# Patient Record
Sex: Male | Born: 1975 | Race: White | Hispanic: No | Marital: Married | Smoking: Former smoker
Health system: Southern US, Community
[De-identification: ages and names within clinical notes are randomized; demographics above are authoritative.]

## PROBLEM LIST (undated history)

## (undated) DIAGNOSIS — K219 Gastro-esophageal reflux disease without esophagitis: Secondary | ICD-10-CM

## (undated) DIAGNOSIS — K760 Fatty (change of) liver, not elsewhere classified: Secondary | ICD-10-CM

## (undated) DIAGNOSIS — G709 Myoneural disorder, unspecified: Secondary | ICD-10-CM

## (undated) DIAGNOSIS — M5030 Other cervical disc degeneration, unspecified cervical region: Secondary | ICD-10-CM

## (undated) DIAGNOSIS — I10 Essential (primary) hypertension: Secondary | ICD-10-CM

## (undated) DIAGNOSIS — J302 Other seasonal allergic rhinitis: Secondary | ICD-10-CM

## (undated) DIAGNOSIS — F419 Anxiety disorder, unspecified: Secondary | ICD-10-CM

## (undated) DIAGNOSIS — E785 Hyperlipidemia, unspecified: Secondary | ICD-10-CM

## (undated) HISTORY — DX: Hyperlipidemia, unspecified: E78.5

## (undated) HISTORY — PX: LUMBAR LAMINECTOMY/DECOMPRESSION MICRODISCECTOMY: SHX5026

## (undated) HISTORY — DX: Fatty (change of) liver, not elsewhere classified: K76.0

## (undated) HISTORY — PX: HERNIA REPAIR: SHX51

## (undated) HISTORY — PX: BRONCHOSCOPY: SUR163

## (undated) HISTORY — PX: WISDOM TOOTH EXTRACTION: SHX21

## (undated) HISTORY — DX: Essential (primary) hypertension: I10

---

## 1999-07-28 ENCOUNTER — Emergency Department (HOSPITAL_COMMUNITY): Admission: EM | Admit: 1999-07-28 | Discharge: 1999-07-28 | Payer: Self-pay | Admitting: Emergency Medicine

## 2001-10-01 ENCOUNTER — Ambulatory Visit (HOSPITAL_COMMUNITY): Admission: RE | Admit: 2001-10-01 | Discharge: 2001-10-01 | Payer: Self-pay | Admitting: *Deleted

## 2005-03-08 ENCOUNTER — Emergency Department (HOSPITAL_COMMUNITY): Admission: EM | Admit: 2005-03-08 | Discharge: 2005-03-08 | Payer: Self-pay | Admitting: Emergency Medicine

## 2016-05-28 ENCOUNTER — Emergency Department (HOSPITAL_BASED_OUTPATIENT_CLINIC_OR_DEPARTMENT_OTHER)
Admission: EM | Admit: 2016-05-28 | Discharge: 2016-05-28 | Disposition: A | Discharge status: Home / Self Care | Payer: 59 | Attending: Physician Assistant | Admitting: Physician Assistant

## 2016-05-28 ENCOUNTER — Encounter (HOSPITAL_BASED_OUTPATIENT_CLINIC_OR_DEPARTMENT_OTHER): Payer: Self-pay | Admitting: *Deleted

## 2016-05-28 DIAGNOSIS — Z79899 Other long term (current) drug therapy: Secondary | ICD-10-CM | POA: Insufficient documentation

## 2016-05-28 DIAGNOSIS — Y929 Unspecified place or not applicable: Secondary | ICD-10-CM | POA: Diagnosis not present

## 2016-05-28 DIAGNOSIS — Y93F2 Activity, caregiving, lifting: Secondary | ICD-10-CM | POA: Insufficient documentation

## 2016-05-28 DIAGNOSIS — X500XXA Overexertion from strenuous movement or load, initial encounter: Secondary | ICD-10-CM | POA: Insufficient documentation

## 2016-05-28 DIAGNOSIS — Y999 Unspecified external cause status: Secondary | ICD-10-CM | POA: Insufficient documentation

## 2016-05-28 DIAGNOSIS — S39012A Strain of muscle, fascia and tendon of lower back, initial encounter: Secondary | ICD-10-CM | POA: Diagnosis not present

## 2016-05-28 DIAGNOSIS — F17290 Nicotine dependence, other tobacco product, uncomplicated: Secondary | ICD-10-CM | POA: Diagnosis not present

## 2016-05-28 DIAGNOSIS — M545 Low back pain: Secondary | ICD-10-CM | POA: Diagnosis present

## 2016-05-28 HISTORY — DX: Other cervical disc degeneration, unspecified cervical region: M50.30

## 2016-05-28 MED ORDER — DIAZEPAM 2 MG PO TABS
2.0000 mg | ORAL_TABLET | Freq: Once | ORAL | Status: AC
  Administered 2016-05-28: 2 mg via ORAL
  Filled 2016-05-28: qty 1

## 2016-05-28 MED ORDER — OXYCODONE-ACETAMINOPHEN 5-325 MG PO TABS
1.0000 | ORAL_TABLET | Freq: Four times a day (QID) | ORAL | 0 refills | Status: DC | PRN
Start: 2016-05-28 — End: 2016-10-26

## 2016-05-28 MED ORDER — DIAZEPAM 5 MG PO TABS: 5.0000 mg | ORAL_TABLET | Freq: Three times a day (TID) | ORAL | 0 refills | Status: DC | PRN

## 2016-05-28 MED ORDER — OXYCODONE-ACETAMINOPHEN 5-325 MG PO TABS
1.0000 | ORAL_TABLET | Freq: Once | ORAL | Status: AC
  Administered 2016-05-28: 1 via ORAL
  Filled 2016-05-28: qty 1

## 2016-05-28 NOTE — ED Notes (Signed)
EDP at [facility], pt alert, NAD, calm, interactive, resps e/u, cooperative.

## 2016-05-28 NOTE — Discharge Instructions (Signed)
You were seen today for a strained back. Please return with fever, weakness, any concerning symptoms. Please follow-up with [facility] orthopedics tomorrow morning.

## 2016-05-28 NOTE — ED Provider Notes (Signed)
[facility] DEPT [facility] Provider Note   CSN: [id number] Arrival date & time: 05/28/16  1815  By signing my name below, I, Irene Pap, attest that this documentation has been prepared under the direction and in the presence of Jakye Mullens Julio Alm, MD. Electronically Signed: Irene Pap, ED Scribe. 05/28/16. 7:51 PM.  History   Chief Complaint Chief Complaint  Patient presents with  . Back Pain   The history is provided by the patient. No language interpreter was used.    HPI Comments: Oluwatamilore Monn Absher is a 40 y.o. male with a hx of cervical DDD who presents to the Emergency Department complaining of sudden onset, tight, stabbing, sharp lower back pain onset earlier this morning. He reports associated occasional tingling to the right lower extremity. Pt states that he was packing to go on vacation this morning when he felt a pop in his lower back. He did not do heavy lifting. He states that the pain radiates to bilateral hips and anterior thighs, right worse than left. He reports worsening pain with ambulation. Pt follows up with a spinal surgeon at [facility] for his DDD. He has taken Tylenol, ibuprofen, and hydrocodone for pain to no relief. He denies injury, fever, chills, bladder or bowel incontinence, numbness or weakness. Pt is allergic to Tramadol.  Past Medical History:  Diagnosis Date  . DDD (degenerative disc disease), cervical     There are no active problems to display for this patient.   History reviewed. No pertinent surgical history.     Home Medications    Prior to Admission medications   Medication Sig Start Date End Date Taking? Authorizing Provider  omeprazole (PRILOSEC) 20 MG capsule Take 20 mg by mouth daily.   Yes Historical Provider, MD    Family History No family history on file.  Social History Social History  Substance Use Topics  . Smoking status: Former Research scientist (life sciences)  . Smokeless tobacco: Current User  . Alcohol use Yes   Comment: occassional     Allergies   Tramadol   Review of Systems Review of Systems  Constitutional: Negative for chills and fever.  Musculoskeletal: Positive for back pain.  Neurological: Negative for weakness and numbness.  All other systems reviewed and are negative. Physical Exam Updated Vital Signs BP (!) 175/103 (BP Location: Right Arm)   Pulse 98   Temp 98.8 F (37.1 C) (Oral)   Resp 18   Ht 6\' 3"  (1.905 m)   Wt 210 lb (95.3 kg)   SpO2 98%   BMI 26.25 kg/m   Physical Exam  Constitutional: He is oriented to person, place, and time. He appears well-developed and well-nourished.  HENT:  Head: Normocephalic and atraumatic.  Eyes: EOM are normal. Pupils are equal, round, and reactive to light.  Neck: Normal range of motion. Neck supple.  Cardiovascular: Normal rate, regular rhythm and normal heart sounds.  Exam reveals no gallop and no friction rub.   No murmur heard. Pulmonary/Chest: Effort normal and breath sounds normal. He has no wheezes.  Abdominal: Soft. There is no tenderness.  Musculoskeletal: Normal range of motion.  Tenderness to the paraspinal region of the lumbar spine; no tenderness to the midline C, T, or L spine.  Neurological: He is alert and oriented to person, place, and time. He has normal strength. No cranial nerve deficit or sensory deficit.  Moves all extremities X4  Skin: Skin is warm and dry.  Psychiatric: He has a normal mood and affect. His behavior is normal.  Nursing note and vitals reviewed.    ED Treatments / Results  DIAGNOSTIC STUDIES: Oxygen Saturation is 98% on RA, normal by my interpretation.    COORDINATION OF CARE: 7:48 PM-Discussed treatment plan which includes percocet, valium, and follow up with orthopedics with pt at bedside and pt agreed to plan.    Labs (all labs ordered are listed, but only abnormal results are displayed) Labs Reviewed - No data to display  EKG  EKG Interpretation None       Radiology No  results found.  Procedures Procedures (including critical care time)  Medications Ordered in ED Medications - No data to display   Initial Impression / Assessment and Plan / ED Course  I have reviewed the triage vital signs and the nursing notes.  Pertinent labs & imaging results that were available during my care of the patient were reviewed by me and considered in my medical decision making (see chart for details).  Clinical Course    Patient is a 40 year old male presenting with back pain after lifting something earlier today. No red flags. No fever or weakness no incontinence. Likely sciatica. Patient seen by [facility] orthopedics he has had multiple cortisone shots in his spine before for degenerative disc disorder. We'll give him 2 days of Percocet and Valium to help with his pain. We encouraged follow-up with orthopedics anmd primary care physician. He says he will follow-up with [facility] orthopedics in the morning.   Final Clinical Impressions(s) / ED Diagnoses   Final diagnoses:  None  I personally performed the services described in this documentation, which was scribed in my presence. The recorded information has been reviewed and is accurate.     New Prescriptions New Prescriptions   No medications on file     Xylon Croom Julio Alm, MD 05/28/16 2005

## 2016-05-28 NOTE — ED Triage Notes (Signed)
Patient states he was packing up to leave the beach this morning when he felt a pop in his lower back.  Describes the pain as tightness in the bilateral lower back with radiation into the bilateral hips, and anterior thighs.  Right is worse than the left.  Patient took tylenol, ibuprofen and one old hydrocodone with no relief.  History of DDD of the cervical spine, but this is the first problems with the lower back.

## 2016-05-28 NOTE — ED Notes (Signed)
D/c'd by Zada Finders, RN, not RH, RN

## 2016-10-20 ENCOUNTER — Ambulatory Visit
Admission: RE | Admit: 2016-10-20 | Discharge: 2016-10-20 | Disposition: A | Discharge status: Home / Self Care | Payer: 59 | Source: Ambulatory Visit | Attending: Neurosurgery | Admitting: Neurosurgery

## 2016-10-20 ENCOUNTER — Other Ambulatory Visit: Payer: Self-pay | Admitting: Neurosurgery

## 2016-10-20 DIAGNOSIS — M4722 Other spondylosis with radiculopathy, cervical region: Principal | ICD-10-CM

## 2016-10-20 DIAGNOSIS — M4712 Other spondylosis with myelopathy, cervical region: Secondary | ICD-10-CM

## 2016-10-23 ENCOUNTER — Other Ambulatory Visit (HOSPITAL_COMMUNITY): Payer: Self-pay | Admitting: *Deleted

## 2016-10-23 NOTE — Pre-Procedure Instructions (Signed)
Brian Tran  10/23/2016    Your procedure is scheduled on Wednesday, October 25, 2016 at 10:00 AM.   Report to [facility] Entrance "A" Admitting Office at 8:00 AM.   Call this number if you have problems the morning of surgery: [phone]   Remember:  Do not eat food or drink liquids after midnight tonight.  Take these medicines the morning of surgery with A SIP OF WATER: Omeprazole (Prilosec) - if needed, Tylenol - if needed   Do not wear jewelry.  Do not wear lotions, powders or cologne.  Men may shave face and neck.  Do not bring valuables to the hospital.  [facility] is not responsible for any belongings or valuables.  Contacts, dentures or bridgework may not be worn into surgery.  Leave your suitcase in the car.  After surgery it may be brought to your room.  For patients admitted to the hospital, discharge time will be determined by your treatment team.  Patients discharged the day of surgery will not be allowed to drive home.   Special instructions:  [facility] - Preparing for Surgery  Before surgery, you can play an important role.  Because skin is not sterile, your skin needs to be as free of germs as possible.  You can reduce the number of germs on you skin by washing with CHG (chlorahexidine gluconate) soap before surgery.  CHG is an antiseptic cleaner which kills germs and bonds with the skin to continue killing germs even after washing.  Please DO NOT use if you have an allergy to CHG or antibacterial soaps.  If your skin becomes reddened/irritated stop using the CHG and inform your nurse when you arrive at Short Stay.  Do not shave (including legs and underarms) for at least 48 hours prior to the first CHG shower.  You may shave your face.  Please follow these instructions carefully:   1.  Shower with CHG Soap the night before surgery and the                    morning of Surgery.  2.  If you choose to wash your hair, wash your hair first as usual  with your       normal shampoo.  3.  After you shampoo, rinse your hair and body thoroughly to remove the shampoo.  4.  Use CHG as you would any other liquid soap.  You can apply chg directly       to the skin and wash gently with scrungie or a clean washcloth.  5.  Apply the CHG Soap to your body ONLY FROM THE NECK DOWN.        Do not use on open wounds or open sores.  Avoid contact with your eyes, ears, mouth and genitals (private parts).  Wash genitals (private parts) with your normal soap.  6.  Wash thoroughly, paying special attention to the area where your surgery        will be performed.  7.  Thoroughly rinse your body with warm water from the neck down.  8.  DO NOT shower/wash with your normal soap after using and rinsing off       the CHG Soap.  9.  Pat yourself dry with a clean towel.            10.  Wear clean pajamas.            11.  Place clean sheets on  your bed the night of your first shower and do not        sleep with pets.  Day of Surgery  Do not apply any lotions the morning of surgery.  Please wear clean clothes to the hospital.   Please read over the fact sheets that you were given.

## 2016-10-24 ENCOUNTER — Encounter (HOSPITAL_COMMUNITY)
Admission: RE | Admit: 2016-10-24 | Discharge: 2016-10-24 | Disposition: A | Discharge status: Home / Self Care | Payer: 59 | Source: Ambulatory Visit | Attending: Neurosurgery | Admitting: Neurosurgery

## 2016-10-24 ENCOUNTER — Encounter (HOSPITAL_COMMUNITY): Payer: Self-pay

## 2016-10-24 DIAGNOSIS — M4802 Spinal stenosis, cervical region: Secondary | ICD-10-CM | POA: Insufficient documentation

## 2016-10-24 DIAGNOSIS — F419 Anxiety disorder, unspecified: Secondary | ICD-10-CM | POA: Diagnosis not present

## 2016-10-24 DIAGNOSIS — M1990 Unspecified osteoarthritis, unspecified site: Secondary | ICD-10-CM | POA: Diagnosis not present

## 2016-10-24 DIAGNOSIS — Z01812 Encounter for preprocedural laboratory examination: Secondary | ICD-10-CM | POA: Insufficient documentation

## 2016-10-24 DIAGNOSIS — Z87891 Personal history of nicotine dependence: Secondary | ICD-10-CM | POA: Diagnosis not present

## 2016-10-24 DIAGNOSIS — M5001 Cervical disc disorder with myelopathy,  high cervical region: Secondary | ICD-10-CM | POA: Diagnosis not present

## 2016-10-24 DIAGNOSIS — Z0183 Encounter for blood typing: Secondary | ICD-10-CM | POA: Insufficient documentation

## 2016-10-24 DIAGNOSIS — M4712 Other spondylosis with myelopathy, cervical region: Secondary | ICD-10-CM | POA: Diagnosis not present

## 2016-10-24 DIAGNOSIS — K219 Gastro-esophageal reflux disease without esophagitis: Secondary | ICD-10-CM | POA: Diagnosis not present

## 2016-10-24 HISTORY — DX: Myoneural disorder, unspecified: G70.9

## 2016-10-24 HISTORY — DX: Other seasonal allergic rhinitis: J30.2

## 2016-10-24 HISTORY — DX: Anxiety disorder, unspecified: F41.9

## 2016-10-24 HISTORY — DX: Gastro-esophageal reflux disease without esophagitis: K21.9

## 2016-10-24 LAB — CBC
HCT: 48.9 % (ref 39.0–52.0)
Hemoglobin: 16.6 g/dL (ref 13.0–17.0)
MCH: 32.8 pg (ref 26.0–34.0)
MCHC: 33.9 g/dL (ref 30.0–36.0)
MCV: 96.6 fL (ref 78.0–100.0)
PLATELETS: 242 10*3/uL (ref 150–400)
RBC: 5.06 MIL/uL (ref 4.22–5.81)
RDW: 12.4 % (ref 11.5–15.5)
WBC: 6.5 10*3/uL (ref 4.0–10.5)

## 2016-10-24 LAB — TYPE AND SCREEN
ABO/RH(D): O POS
Antibody Screen: NEGATIVE

## 2016-10-24 LAB — SURGICAL PCR SCREEN
MRSA, PCR: NEGATIVE
Staphylococcus aureus: NEGATIVE

## 2016-10-24 LAB — ABO/RH: ABO/RH(D): O POS

## 2016-10-24 NOTE — Progress Notes (Signed)
Pt denies cardiac history, chest pain or sob. 

## 2016-10-25 ENCOUNTER — Encounter (HOSPITAL_COMMUNITY): Payer: Self-pay | Admitting: *Deleted

## 2016-10-25 ENCOUNTER — Observation Stay (HOSPITAL_COMMUNITY)
Admission: RE | Admit: 2016-10-25 | Discharge: 2016-10-26 | Disposition: A | Discharge status: Home / Self Care | Payer: 59 | Source: Ambulatory Visit | Attending: Neurosurgery | Admitting: Neurosurgery

## 2016-10-25 ENCOUNTER — Encounter (HOSPITAL_COMMUNITY)
Admission: RE | Disposition: A | Discharge status: Home / Self Care | Payer: Self-pay | Source: Ambulatory Visit | Attending: Neurosurgery

## 2016-10-25 ENCOUNTER — Ambulatory Visit (HOSPITAL_COMMUNITY): Payer: 59 | Admitting: Certified Registered Nurse Anesthetist

## 2016-10-25 ENCOUNTER — Ambulatory Visit (HOSPITAL_COMMUNITY): Payer: 59

## 2016-10-25 DIAGNOSIS — F419 Anxiety disorder, unspecified: Secondary | ICD-10-CM | POA: Insufficient documentation

## 2016-10-25 DIAGNOSIS — Z87891 Personal history of nicotine dependence: Secondary | ICD-10-CM | POA: Insufficient documentation

## 2016-10-25 DIAGNOSIS — M1990 Unspecified osteoarthritis, unspecified site: Secondary | ICD-10-CM | POA: Insufficient documentation

## 2016-10-25 DIAGNOSIS — K219 Gastro-esophageal reflux disease without esophagitis: Secondary | ICD-10-CM | POA: Insufficient documentation

## 2016-10-25 DIAGNOSIS — M4802 Spinal stenosis, cervical region: Secondary | ICD-10-CM | POA: Diagnosis not present

## 2016-10-25 DIAGNOSIS — Z419 Encounter for procedure for purposes other than remedying health state, unspecified: Secondary | ICD-10-CM

## 2016-10-25 DIAGNOSIS — M5020 Other cervical disc displacement, unspecified cervical region: Secondary | ICD-10-CM | POA: Diagnosis present

## 2016-10-25 DIAGNOSIS — M5001 Cervical disc disorder with myelopathy,  high cervical region: Secondary | ICD-10-CM | POA: Insufficient documentation

## 2016-10-25 DIAGNOSIS — M4712 Other spondylosis with myelopathy, cervical region: Secondary | ICD-10-CM | POA: Insufficient documentation

## 2016-10-25 HISTORY — PX: ANTERIOR CERVICAL DECOMPRESSION/DISCECTOMY FUSION 4 LEVELS: SHX5556

## 2016-10-25 SURGERY — ANTERIOR CERVICAL DECOMPRESSION/DISCECTOMY FUSION 4 LEVELS
Anesthesia: General

## 2016-10-25 MED ORDER — OXYCODONE HCL 5 MG PO TABS: 5.0000 mg | ORAL_TABLET | Freq: Once | ORAL | Status: DC | PRN

## 2016-10-25 MED ORDER — LACTATED RINGERS IV SOLN
INTRAVENOUS | Status: DC | PRN
  Administered 2016-10-25 (×2): via INTRAVENOUS

## 2016-10-25 MED ORDER — HYDROMORPHONE HCL 1 MG/ML IJ SOLN
INTRAMUSCULAR | Status: AC
  Filled 2016-10-25: qty 0.5

## 2016-10-25 MED ORDER — ARTIFICIAL TEARS OP OINT
TOPICAL_OINTMENT | OPHTHALMIC | Status: AC
  Filled 2016-10-25: qty 3.5

## 2016-10-25 MED ORDER — PANTOPRAZOLE SODIUM 40 MG PO TBEC
80.0000 mg | DELAYED_RELEASE_TABLET | Freq: Every day | ORAL | Status: DC
  Filled 2016-10-25: qty 2

## 2016-10-25 MED ORDER — FENTANYL CITRATE (PF) 100 MCG/2ML IJ SOLN
INTRAMUSCULAR | Status: AC
  Filled 2016-10-25: qty 2

## 2016-10-25 MED ORDER — LIDOCAINE-EPINEPHRINE (PF) 2 %-1:200000 IJ SOLN
INTRAMUSCULAR | Status: DC | PRN
Start: 2016-10-25 — End: 2016-10-25
  Administered 2016-10-25: 17 mL via INTRADERMAL

## 2016-10-25 MED ORDER — ONDANSETRON HCL 4 MG/2ML IJ SOLN: 4.0000 mg | Freq: Four times a day (QID) | INTRAMUSCULAR | Status: DC | PRN

## 2016-10-25 MED ORDER — CEFAZOLIN SODIUM-DEXTROSE 2-4 GM/100ML-% IV SOLN
2.0000 g | INTRAVENOUS | Status: AC
  Administered 2016-10-25 (×2): 2 g via INTRAVENOUS

## 2016-10-25 MED ORDER — CYCLOBENZAPRINE HCL 5 MG PO TABS
5.0000 mg | ORAL_TABLET | Freq: Three times a day (TID) | ORAL | Status: DC | PRN
  Administered 2016-10-25 (×2): 10 mg via ORAL
  Filled 2016-10-25: qty 2

## 2016-10-25 MED ORDER — HYDROXYZINE HCL 25 MG PO TABS: 50.0000 mg | ORAL_TABLET | ORAL | Status: DC | PRN

## 2016-10-25 MED ORDER — DEXAMETHASONE SODIUM PHOSPHATE 4 MG/ML IJ SOLN
INTRAMUSCULAR | Status: AC
  Administered 2016-10-25: 4 mg
  Filled 2016-10-25: qty 1

## 2016-10-25 MED ORDER — LIDOCAINE 2% (20 MG/ML) 5 ML SYRINGE
INTRAMUSCULAR | Status: AC
  Filled 2016-10-25: qty 5

## 2016-10-25 MED ORDER — ACETAMINOPHEN 10 MG/ML IV SOLN
INTRAVENOUS | Status: AC
  Filled 2016-10-25: qty 100

## 2016-10-25 MED ORDER — SODIUM CHLORIDE 0.9% FLUSH
3.0000 mL | Freq: Two times a day (BID) | INTRAVENOUS | Status: DC
  Administered 2016-10-25 – 2016-10-26 (×2): 3 mL via INTRAVENOUS

## 2016-10-25 MED ORDER — ROCURONIUM BROMIDE 50 MG/5ML IV SOSY
PREFILLED_SYRINGE | INTRAVENOUS | Status: AC
  Filled 2016-10-25: qty 5

## 2016-10-25 MED ORDER — SUGAMMADEX SODIUM 200 MG/2ML IV SOLN
INTRAVENOUS | Status: DC | PRN
  Administered 2016-10-25: 200 mg via INTRAVENOUS

## 2016-10-25 MED ORDER — 0.9 % SODIUM CHLORIDE (POUR BTL) OPTIME
TOPICAL | Status: DC | PRN
  Administered 2016-10-25: 1000 mL

## 2016-10-25 MED ORDER — KETOROLAC TROMETHAMINE 30 MG/ML IJ SOLN
30.0000 mg | Freq: Once | INTRAMUSCULAR | Status: AC
  Administered 2016-10-25: 30 mg via INTRAVENOUS

## 2016-10-25 MED ORDER — THROMBIN 20000 UNITS EX SOLR
CUTANEOUS | Status: AC
  Filled 2016-10-25: qty 20000

## 2016-10-25 MED ORDER — ALPRAZOLAM 1 MG PO TABS: 2.5000 mg | ORAL_TABLET | Freq: Every evening | ORAL | Status: DC | PRN

## 2016-10-25 MED ORDER — LIDOCAINE 2% (20 MG/ML) 5 ML SYRINGE
INTRAMUSCULAR | Status: DC | PRN
  Administered 2016-10-25: 60 mg via INTRAVENOUS

## 2016-10-25 MED ORDER — LIDOCAINE HCL 4 % EX SOLN
CUTANEOUS | Status: DC | PRN
  Administered 2016-10-25: 2 mL via TOPICAL

## 2016-10-25 MED ORDER — SUGAMMADEX SODIUM 200 MG/2ML IV SOLN
INTRAVENOUS | Status: AC
  Filled 2016-10-25: qty 2

## 2016-10-25 MED ORDER — LACTATED RINGERS IV SOLN
INTRAVENOUS | Status: DC
  Administered 2016-10-25 (×3): via INTRAVENOUS

## 2016-10-25 MED ORDER — BISACODYL 10 MG RE SUPP: 10.0000 mg | Freq: Every day | RECTAL | Status: DC | PRN

## 2016-10-25 MED ORDER — HYDROMORPHONE HCL 1 MG/ML IJ SOLN
0.2500 mg | INTRAMUSCULAR | Status: DC | PRN
  Administered 2016-10-25 (×2): 0.5 mg via INTRAVENOUS

## 2016-10-25 MED ORDER — SODIUM CHLORIDE 0.9% FLUSH: 3.0000 mL | INTRAVENOUS | Status: DC | PRN

## 2016-10-25 MED ORDER — FENTANYL CITRATE (PF) 100 MCG/2ML IJ SOLN
INTRAMUSCULAR | Status: DC | PRN
  Administered 2016-10-25 (×3): 100 ug via INTRAVENOUS
  Administered 2016-10-25 (×2): 50 ug via INTRAVENOUS

## 2016-10-25 MED ORDER — HYDROXYZINE HCL 50 MG/ML IM SOLN: 50.0000 mg | INTRAMUSCULAR | Status: DC | PRN

## 2016-10-25 MED ORDER — KETOROLAC TROMETHAMINE 30 MG/ML IJ SOLN
30.0000 mg | Freq: Four times a day (QID) | INTRAMUSCULAR | Status: DC
  Administered 2016-10-25 – 2016-10-26 (×2): 30 mg via INTRAVENOUS
  Filled 2016-10-25 (×2): qty 1

## 2016-10-25 MED ORDER — LIDOCAINE-EPINEPHRINE (PF) 2 %-1:200000 IJ SOLN
INTRAMUSCULAR | Status: AC
  Filled 2016-10-25: qty 20

## 2016-10-25 MED ORDER — ROCURONIUM BROMIDE 100 MG/10ML IV SOLN
INTRAVENOUS | Status: DC | PRN
  Administered 2016-10-25: 20 mg via INTRAVENOUS
  Administered 2016-10-25 (×2): 10 mg via INTRAVENOUS
  Administered 2016-10-25: 20 mg via INTRAVENOUS
  Administered 2016-10-25: 50 mg via INTRAVENOUS
  Administered 2016-10-25: 20 mg via INTRAVENOUS
  Administered 2016-10-25: 10 mg via INTRAVENOUS

## 2016-10-25 MED ORDER — SODIUM CHLORIDE 0.9 % IR SOLN
Status: DC | PRN
  Administered 2016-10-25: 10:00:00

## 2016-10-25 MED ORDER — CYCLOBENZAPRINE HCL 10 MG PO TABS
ORAL_TABLET | ORAL | Status: AC
  Filled 2016-10-25: qty 1

## 2016-10-25 MED ORDER — CEFAZOLIN SODIUM 1 G IJ SOLR
INTRAMUSCULAR | Status: AC
  Filled 2016-10-25: qty 20

## 2016-10-25 MED ORDER — MIDAZOLAM HCL 2 MG/2ML IJ SOLN
INTRAMUSCULAR | Status: AC
  Filled 2016-10-25: qty 2

## 2016-10-25 MED ORDER — CHLORHEXIDINE GLUCONATE CLOTH 2 % EX PADS: 6.0000 | MEDICATED_PAD | Freq: Once | CUTANEOUS | Status: DC

## 2016-10-25 MED ORDER — THROMBIN 5000 UNITS EX SOLR
CUTANEOUS | Status: AC
  Filled 2016-10-25: qty 5000

## 2016-10-25 MED ORDER — KCL IN DEXTROSE-NACL 20-5-0.45 MEQ/L-%-% IV SOLN: INTRAVENOUS | Status: DC

## 2016-10-25 MED ORDER — OXYCODONE-ACETAMINOPHEN 5-325 MG PO TABS
1.0000 | ORAL_TABLET | ORAL | Status: DC | PRN
  Administered 2016-10-25: 2 via ORAL

## 2016-10-25 MED ORDER — ACETAMINOPHEN 650 MG RE SUPP: 650.0000 mg | RECTAL | Status: DC | PRN

## 2016-10-25 MED ORDER — MORPHINE SULFATE (PF) 4 MG/ML IV SOLN: 4.0000 mg | INTRAVENOUS | Status: DC | PRN

## 2016-10-25 MED ORDER — FLEET ENEMA 7-19 GM/118ML RE ENEM
1.0000 | ENEMA | Freq: Once | RECTAL | Status: DC | PRN
Start: 2016-10-25 — End: 2016-10-26

## 2016-10-25 MED ORDER — OXYCODONE HCL 5 MG/5ML PO SOLN: 5.0000 mg | Freq: Once | ORAL | Status: DC | PRN

## 2016-10-25 MED ORDER — MAGNESIUM HYDROXIDE 400 MG/5ML PO SUSP: 30.0000 mL | Freq: Every day | ORAL | Status: DC | PRN

## 2016-10-25 MED ORDER — HYDROCODONE-ACETAMINOPHEN 5-325 MG PO TABS
1.0000 | ORAL_TABLET | ORAL | Status: DC | PRN
  Administered 2016-10-25 – 2016-10-26 (×4): 2 via ORAL
  Filled 2016-10-25 (×4): qty 2

## 2016-10-25 MED ORDER — DEXAMETHASONE SODIUM PHOSPHATE 10 MG/ML IJ SOLN
INTRAMUSCULAR | Status: AC
  Filled 2016-10-25: qty 1

## 2016-10-25 MED ORDER — BUPIVACAINE HCL (PF) 0.5 % IJ SOLN
INTRAMUSCULAR | Status: DC | PRN
  Administered 2016-10-25: 17 mL

## 2016-10-25 MED ORDER — MIDAZOLAM HCL 2 MG/2ML IJ SOLN
INTRAMUSCULAR | Status: DC | PRN
  Administered 2016-10-25: 2 mg via INTRAVENOUS

## 2016-10-25 MED ORDER — SODIUM CHLORIDE 0.9 % IV SOLN
8.0000 mg | Freq: Four times a day (QID) | INTRAVENOUS | Status: DC | PRN
  Filled 2016-10-25: qty 4

## 2016-10-25 MED ORDER — ONDANSETRON HCL 4 MG/2ML IJ SOLN
INTRAMUSCULAR | Status: AC
  Filled 2016-10-25: qty 2

## 2016-10-25 MED ORDER — ALUM & MAG HYDROXIDE-SIMETH 200-200-20 MG/5ML PO SUSP: 30.0000 mL | Freq: Four times a day (QID) | ORAL | Status: DC | PRN

## 2016-10-25 MED ORDER — ACETAMINOPHEN 325 MG PO TABS: 650.0000 mg | ORAL_TABLET | ORAL | Status: DC | PRN

## 2016-10-25 MED ORDER — PHENYLEPHRINE HCL 10 MG/ML IJ SOLN
INTRAVENOUS | Status: DC | PRN
  Administered 2016-10-25: 25 ug/min via INTRAVENOUS

## 2016-10-25 MED ORDER — OXYCODONE-ACETAMINOPHEN 5-325 MG PO TABS
ORAL_TABLET | ORAL | Status: AC
  Filled 2016-10-25: qty 2

## 2016-10-25 MED ORDER — ACETAMINOPHEN 10 MG/ML IV SOLN
INTRAVENOUS | Status: DC | PRN
  Administered 2016-10-25: 1000 mg via INTRAVENOUS

## 2016-10-25 MED ORDER — PROPOFOL 10 MG/ML IV BOLUS
INTRAVENOUS | Status: AC
  Filled 2016-10-25: qty 20

## 2016-10-25 MED ORDER — KETOROLAC TROMETHAMINE 30 MG/ML IJ SOLN
INTRAMUSCULAR | Status: AC
  Filled 2016-10-25: qty 1

## 2016-10-25 MED ORDER — PROPOFOL 10 MG/ML IV BOLUS
INTRAVENOUS | Status: DC | PRN
  Administered 2016-10-25: 200 mg via INTRAVENOUS

## 2016-10-25 MED ORDER — CEFAZOLIN SODIUM-DEXTROSE 2-4 GM/100ML-% IV SOLN
INTRAVENOUS | Status: AC
  Filled 2016-10-25: qty 100

## 2016-10-25 MED ORDER — ONDANSETRON HCL 4 MG/2ML IJ SOLN
INTRAMUSCULAR | Status: DC | PRN
Start: 2016-10-25 — End: 2016-10-25
  Administered 2016-10-25: 4 mg via INTRAVENOUS

## 2016-10-25 MED ORDER — BUPIVACAINE HCL (PF) 0.5 % IJ SOLN
INTRAMUSCULAR | Status: AC
  Filled 2016-10-25: qty 30

## 2016-10-25 MED ORDER — FENTANYL CITRATE (PF) 100 MCG/2ML IJ SOLN
INTRAMUSCULAR | Status: AC
  Filled 2016-10-25: qty 4

## 2016-10-25 MED ORDER — THROMBIN 5000 UNITS EX SOLR
CUTANEOUS | Status: DC | PRN
  Administered 2016-10-25: 10:00:00 via TOPICAL

## 2016-10-25 MED ORDER — DEXAMETHASONE SODIUM PHOSPHATE 4 MG/ML IJ SOLN
4.0000 mg | Freq: Four times a day (QID) | INTRAMUSCULAR | Status: DC
  Administered 2016-10-25 – 2016-10-26 (×3): 4 mg via INTRAVENOUS
  Filled 2016-10-25 (×3): qty 1

## 2016-10-25 MED ORDER — THROMBIN 20000 UNITS EX SOLR
CUTANEOUS | Status: DC | PRN
  Administered 2016-10-25 (×2): via TOPICAL

## 2016-10-25 MED ORDER — ONDANSETRON HCL 4 MG PO TABS: 4.0000 mg | ORAL_TABLET | Freq: Four times a day (QID) | ORAL | Status: DC | PRN

## 2016-10-25 MED ORDER — MENTHOL 3 MG MT LOZG: 1.0000 | LOZENGE | OROMUCOSAL | Status: DC | PRN

## 2016-10-25 MED ORDER — DEXAMETHASONE SODIUM PHOSPHATE 10 MG/ML IJ SOLN
INTRAMUSCULAR | Status: DC | PRN
  Administered 2016-10-25: 10 mg via INTRAVENOUS

## 2016-10-25 MED ORDER — PHENOL 1.4 % MT LIQD
1.0000 | OROMUCOSAL | Status: DC | PRN
  Filled 2016-10-25: qty 177

## 2016-10-25 MED ORDER — ALPRAZOLAM 0.5 MG PO TABS: 1.0000 mg | ORAL_TABLET | Freq: Every evening | ORAL | Status: DC | PRN

## 2016-10-25 SURGICAL SUPPLY — 64 items
ALLOGRAFT 7X14X11 (Bone Implant) ×6 IMPLANT
ALLOGRAFT CA 6X14X11 (Bone Implant) ×2 IMPLANT
BAG DECANTER FOR FLEXI CONT (MISCELLANEOUS) ×2 IMPLANT
BIT DRILL HYBRID 2.5X14 (BIT) ×2 IMPLANT
BIT DRILL NEURO 2X3.1 SFT TUCH (MISCELLANEOUS) ×1 IMPLANT
BLADE ULTRA TIP 2M (BLADE) ×2 IMPLANT
CANISTER SUCT 3000ML PPV (MISCELLANEOUS) ×2 IMPLANT
CARTRIDGE OIL MAESTRO DRILL (MISCELLANEOUS) ×1 IMPLANT
COVER MAYO STAND STRL (DRAPES) ×2 IMPLANT
DECANTER SPIKE VIAL GLASS SM (MISCELLANEOUS) ×2 IMPLANT
DERMABOND ADVANCED (GAUZE/BANDAGES/DRESSINGS) ×1
DERMABOND ADVANCED .7 DNX12 (GAUZE/BANDAGES/DRESSINGS) ×1 IMPLANT
DIFFUSER DRILL AIR PNEUMATIC (MISCELLANEOUS) ×2 IMPLANT
DRAPE HALF SHEET 40X57 (DRAPES) ×2 IMPLANT
DRAPE LAPAROTOMY 100X72 PEDS (DRAPES) ×2 IMPLANT
DRAPE MICROSCOPE LEICA (MISCELLANEOUS) ×2 IMPLANT
DRAPE POUCH INSTRU U-SHP 10X18 (DRAPES) ×2 IMPLANT
DRILL NEURO 2X3.1 SOFT TOUCH (MISCELLANEOUS) ×2
ELECT COATED BLADE 2.86 ST (ELECTRODE) ×2 IMPLANT
ELECT REM PT RETURN 9FT ADLT (ELECTROSURGICAL) ×2
ELECTRODE REM PT RTRN 9FT ADLT (ELECTROSURGICAL) ×1 IMPLANT
GLOVE BIOGEL PI IND STRL 7.5 (GLOVE) ×1 IMPLANT
GLOVE BIOGEL PI IND STRL 8 (GLOVE) ×1 IMPLANT
GLOVE BIOGEL PI INDICATOR 7.5 (GLOVE) ×1
GLOVE BIOGEL PI INDICATOR 8 (GLOVE) ×1
GLOVE ECLIPSE 7.5 STRL STRAW (GLOVE) ×6 IMPLANT
GLOVE EXAM NITRILE LRG STRL (GLOVE) IMPLANT
GLOVE EXAM NITRILE XL STR (GLOVE) IMPLANT
GLOVE EXAM NITRILE XS STR PU (GLOVE) IMPLANT
GLOVE INDICATOR 7.0 STRL GRN (GLOVE) ×2 IMPLANT
GLOVE INDICATOR 7.5 STRL GRN (GLOVE) ×2 IMPLANT
GLOVE INDICATOR 8.0 STRL GRN (GLOVE) ×2 IMPLANT
GLOVE SURG SS PI 7.0 STRL IVOR (GLOVE) ×2 IMPLANT
GOWN STRL REUS W/ TWL LRG LVL3 (GOWN DISPOSABLE) ×2 IMPLANT
GOWN STRL REUS W/ TWL XL LVL3 (GOWN DISPOSABLE) ×2 IMPLANT
GOWN STRL REUS W/TWL 2XL LVL3 (GOWN DISPOSABLE) ×2 IMPLANT
GOWN STRL REUS W/TWL LRG LVL3 (GOWN DISPOSABLE) ×2
GOWN STRL REUS W/TWL XL LVL3 (GOWN DISPOSABLE) ×2
HALTER HD/CHIN CERV TRACTION D (MISCELLANEOUS) ×2 IMPLANT
HEMOSTAT POWDER KIT SURGIFOAM (HEMOSTASIS) ×2 IMPLANT
KIT BASIN OR (CUSTOM PROCEDURE TRAY) ×2 IMPLANT
KIT ROOM TURNOVER OR (KITS) ×2 IMPLANT
NEEDLE HYPO 25X1 1.5 SAFETY (NEEDLE) ×2 IMPLANT
NEEDLE SPNL 22GX3.5 QUINCKE BK (NEEDLE) ×4 IMPLANT
NS IRRIG 1000ML POUR BTL (IV SOLUTION) ×2 IMPLANT
OIL CARTRIDGE MAESTRO DRILL (MISCELLANEOUS) ×2
PACK LAMINECTOMY NEURO (CUSTOM PROCEDURE TRAY) ×2 IMPLANT
PAD ARMBOARD 7.5X6 YLW CONV (MISCELLANEOUS) ×6 IMPLANT
PATTIES SURGICAL .5 X.5 (GAUZE/BANDAGES/DRESSINGS) ×2 IMPLANT
PATTIES SURGICAL 1X1 (DISPOSABLE) ×4 IMPLANT
PLATE SPINAL 390MM RADIUS 68MM (Plate) ×2 IMPLANT
RUBBERBAND STERILE (MISCELLANEOUS) ×4 IMPLANT
SCREW R HYBIRD VA 4.0X14MM (Screw) ×20 IMPLANT
SPONGE GAUZE 4X4 12PLY STER LF (GAUZE/BANDAGES/DRESSINGS) ×4 IMPLANT
SPONGE INTESTINAL PEANUT (DISPOSABLE) ×2 IMPLANT
SPONGE SURGIFOAM ABS GEL 100 (HEMOSTASIS) ×4 IMPLANT
STAPLER SKIN PROX WIDE 3.9 (STAPLE) ×2 IMPLANT
SUT VIC AB 0 CT1 18XCR BRD8 (SUTURE) IMPLANT
SUT VIC AB 0 CT1 8-18 (SUTURE)
SUT VIC AB 2-0 CP2 18 (SUTURE) ×4 IMPLANT
SUT VIC AB 3-0 SH 8-18 (SUTURE) ×6 IMPLANT
TOWEL OR 17X24 6PK STRL BLUE (TOWEL DISPOSABLE) ×2 IMPLANT
TOWEL OR 17X26 10 PK STRL BLUE (TOWEL DISPOSABLE) ×2 IMPLANT
WATER STERILE IRR 1000ML POUR (IV SOLUTION) ×2 IMPLANT

## 2016-10-25 NOTE — H&P (Signed)
Subjective: Patient is a 41 y.o. right-handed white male who is admitted for treatment of advanced multilevel degeneration in the cervical spine, with resulting cervical stenosis.Marland Kitchen  He initially presented with acute right-sided low back and right lumbar radicular pain. However he is also had difficulties with posterior neck pain and intrascapular pain. He's had a sense of weakness in the right upper extremity, areas of numbness and tingling in the left upper family including the arm, forearm, hand, with more mild numbness and tingling in the right hand, and a sense of weakness in the proximal lower extremities, with that varying areas of numbness and tingling in the lower extremities. He was studied with x-rays and MRI scan of the cervical and lumbar spine.  In the cervical spine he has disc herniations at C3-4, C4-5, C5-6, and C6-7, with underlying cervical spondylosis and degenerative disease at each level, with resulting cervical stenosis and spinal cord compression due to congenital and degenerative stenosis at the C3-4, C4-5, C5-6, and C6-7 levels were notably the MRI did not show alteration of cord signal. Patient was further studied with CT which showed calcification of the posterior longitudinal ligament behind each of the vertebra, but not across the disc space levels. Patient is admitted now for 4 level C3-4, C4-5, C5-6, and C6-7 anterior cervical decompression and arthrodesis with structural allograft and cervical plating.   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   Past Medical History:  Diagnosis Date  . Anxiety   . DDD (degenerative disc disease), cervical   . GERD (gastroesophageal reflux disease)   . Neuromuscular disorder ([place])    right ulnar nerve pain  . Seasonal allergies     Past  Surgical History:  Procedure Laterality Date  . BRONCHOSCOPY    . HERNIA REPAIR Right    inguinal hernia  . WISDOM TOOTH EXTRACTION      Prescriptions Prior to Admission  Medication Sig Dispense Refill Last Dose  . acetaminophen (TYLENOL) 500 MG tablet Take 1,000 mg by mouth every 6 (six) hours as needed (for pain.).   10/25/2016 at 0500  . alprazolam (XANAX) 2 MG tablet Take 2.5 mg by mouth at bedtime as needed for sleep.   10/25/2016 at 0100  . omeprazole (PRILOSEC) 40 MG capsule Take 40 mg by mouth daily as needed (for heartburn/acid reflux.).   10/25/2016 at 0500  . diazepam (VALIUM) 5 MG tablet Take 1 tablet (5 mg total) by mouth every 8 (eight) hours as needed for anxiety. (Patient not taking: Reported on 10/23/2016) 10 tablet 0 Not Taking at Unknown time  . oxyCODONE-acetaminophen (PERCOCET/ROXICET) 5-325 MG tablet Take 1 tablet by mouth every 6 (six) hours as needed for severe pain. (Patient not taking: Reported on 10/23/2016) 11 tablet 0 Not Taking at Unknown time   No Known Allergies  Social History  Substance Use Topics  . Smoking status: Former Smoker    Quit date: 10/24/1996  . Smokeless tobacco: Current User    Types: Snuff  . Alcohol use Yes     Comment: occassional    Family History  Problem Relation Age of Onset  . Kidney cancer Father   . COPD Father   . Hypertension Father      Review of Systems Pertinent items noted in HPI and remainder of comprehensive ROS otherwise negative.  Objective: Vital signs in last 24 hours: Temp:  [98.6 F (37 C)] 98.6 F (37 C) (02/07 0810) Pulse Rate:  [83] 83 (02/07 0810) BP: (154)/(103) 154/103 (02/07 0810) SpO2:  [99 %] 99 % (02/07 0810) Weight:  [94.8 kg (209 lb)] 94.8 kg (209 lb) (02/07 0810)  EXAM: Patient is well-developed well-nourished white male in no acute distress. Lungs are clear to auscultation , the patient has symmetrical respiratory excursion. Heart has a regular rate and rhythm normal S1 and S2 no murmur.   Abdomen  is soft nontender nondistended bowel sounds are present. Extremity examination shows no clubbing cyanosis or edema. Neurologic examination shows the left upper family has 5/5 strength in the deltoid, biceps, triceps, intrinsics, and grip, but he has weakness in the right upper family, with the right deltoid and biceps being 4-4+ over 5, the right triceps intrinsics are 5/5, and the right grip is 4/5. In the lower extremities, the iliopsoas and quadriceps are 4/5 bilaterally. Sensation is intact to pinprick in the upper and lower lower extremities. Reflexes are one in the quadrants, 2 at the gastrocnemius. They're symmetrical body. Toes are downgoing bilaterally. Gait and stance both favor the right lower extremity, and he limps as he walks.  Data Review:CBC    Component Value Date/Time   WBC 6.5 10/24/2016 1001   RBC 5.06 10/24/2016 1001   HGB 16.6 10/24/2016 1001   HCT 48.9 10/24/2016 1001   PLT 242 10/24/2016 1001   MCV 96.6 10/24/2016 1001   MCH 32.8 10/24/2016 1001   MCHC 33.9 10/24/2016 1001   RDW 12.4 10/24/2016 1001    Assessment/Plan: Patient with degenerative changes and congenital issues involving both the cervical and lumbar spine as described above. He has significant cervical stenosis at 4 levels, is admitted now for 4 level ACDF.  I've discussed with the patient the nature of his condition, the nature the surgical procedure, the typical length of surgery, hospital stay, and overall recuperation. We discussed limitations postoperatively. I discussed risks of surgery including risks of infection, bleeding, possibly need for transfusion, the risk of nerve root dysfunction with pain, weakness, numbness, or paresthesias, the risk of spinal cord dysfunction with paralysis of all 4 limbs and quadriplegia, and the risk of dural tear and CSF leakage and possible need for further surgery, the risk of esophageal dysfunction causing dysphagia and the risk of laryngeal dysfunction causing  hoarseness of the voice, the risk of failure of the arthrodesis and the possible need for further surgery, and the risk of anesthetic complications including myocardial infarction, stroke, pneumonia, and death. We also discussed the need for postoperative immobilization in a cervical collar. Understanding all this the patient does wish to proceed with surgery and is admitted for such.    Hosie Spangle, MD 10/25/2016 9:58 AM

## 2016-10-25 NOTE — Anesthesia Procedure Notes (Signed)
Procedure Name: Intubation Date/Time: 10/25/2016 10:12 AM Performed by: Trixie Deis A Pre-anesthesia Checklist: Patient identified, Emergency Drugs available, Suction available and Patient being monitored Patient Re-evaluated:Patient Re-evaluated prior to inductionOxygen Delivery Method: Circle System Utilized Preoxygenation: Pre-oxygenation with 100% oxygen Intubation Type: IV induction Ventilation: Mask ventilation without difficulty Laryngoscope Size: Glidescope and 4 Grade View: Grade I Tube type: Oral Tube size: 7.5 mm Number of attempts: 1 Airway Equipment and Method: Stylet and Oral airway Placement Confirmation: ETT inserted through vocal cords under direct vision,  positive ETCO2 and breath sounds checked- equal and bilateral Secured at: 23 cm Tube secured with: Tape Dental Injury: Teeth and Oropharynx as per pre-operative assessment

## 2016-10-25 NOTE — Progress Notes (Signed)
Vitals:   10/25/16 1615 10/25/16 1630 10/25/16 1645 10/25/16 1700  BP:    (!) 163/95  Pulse: (!) 107 99 (!) 101   Resp: 17 12 15 20   Temp:   97.2 F (36.2 C) 99.1 F (37.3 C)  TempSrc:      SpO2: 99% 99% 99% 99%  Weight:        CBC  Recent Labs  10/24/16 1001  WBC 6.5  HGB 16.6  HCT 48.9  PLT 242    Patient resting in bed, eating dinner. Wound clean and dry; no swelling, erythema, or drainage. Moving all 4 extremities well. Foley to straight drainage (we'll plan on removing Foley once ambulating in the halls).  Plan: Doing well following surgery. Encouraged to ambulate. Will continue to progress to postoperative recovery.  Hosie Spangle, MD 10/25/2016, 7:26 PM

## 2016-10-25 NOTE — Op Note (Signed)
10/25/2016  3:18 PM  PATIENT:  Brian Tran  41 y.o. male  PRE-OPERATIVE DIAGNOSIS:  Cervical stenosis, multilevel cervical disc herniations with myelopathy, cervical spondylosis with myelopathy, cervical degenerative disease  POST-OPERATIVE DIAGNOSIS:  Cervical stenosis, multilevel cervical disc herniations with myelopathy, cervical spondylosis with myelopathy, cervical degenerative disease  PROCEDURE:  Procedure(s):  C3-4, C4-5, C5-6, and C6-7 anterior cervical decompression and arthrodesis with structural allograft and Dynatran cervical plating  SURGEON:  Surgeon(s): Jovita Gamma, MD Eustace Moore, MD  ASSISTANTS: Sherley Bounds, M.D.  ANESTHESIA:   general  EBL:  Total I/O In: 2300 [I.V.:2300] Out: 780 [Urine:480; Blood:300]  BLOOD ADMINISTERED:none  COUNT: Correct per nursing staff  DICTATION: Patient was brought to the operating room placed under general endotracheal anesthesia. Patient was placed in 10 pounds of halter traction. The neck was prepped with Betadine soap and solution and draped in a sterile fashion. A obliquel incision was made on the left side of the neck paralleling the anterior border of the sternocleidomastoid.. The line of the incision was infiltrated with local anesthetic with epinephrine. Dissection was carried down thru the subcutaneous tissue and platysma, bipolar cautery was used to maintain hemostasis. Dissection was then carried out thru an avascular plane leaving the sternocleidomastoid carotid artery and jugular vein laterally and the trachea and esophagus medially. The ventral aspect of the vertebral column was identified and a localizing x-ray was taken. The C3-4, C4-5, C5-6, and C6-7 levels were identified. The annulus at each level was incised and the disc space entered. Discectomy was performed with micro-curettes and pituitary rongeurs. The operating microscope was draped and brought into the field provided additional magnification illumination and  visualization. Discectomy was continued posteriorly thru the disc space and then the cartilaginous endplate was removed using micro-curettes along with the high-speed drill. Posterior osteophytic overgrowth was removed each level using the high-speed drill along with a 2 mm thin footplated Kerrison punch. We were able to remove the disc herniation and posterior longitudinal ligament at the C3-4 C5-6 and C6-7 levels, decompressing the spinal canal and thecal sac at those levels. However at C4-5 we removed the disc herniation, but the posterior longitudinal ligament was adherent to the dura. It is felt that good decompression of the canal and thereby the thecal sac had been achieved, and that the risk of dural tear and CSF leakage by removing the posterior longitudinal ligament out weighted the risk of leaving it in place. Osteophytic overgrowth and disc material encroaching and impinging on the neural foramina and exiting nerve roots was removed bilaterally at each level. Once the decompression was completed hemostasis was established at each level with the use of Gelfoam with thrombin and bipolar cautery. The Gelfoam was removed, a thin layer of Surgifoam applied, the wound irrigated and hemostasis confirmed. We then measured the height of each intravertebral disc space level and selected a 7 millimeter in height structural allograft for the C3-4 level, a 7 millimeter in height structural allograft for the C4-5 level, a 6 millimeter in height structural allograft for the C5-6 level, and a 7 millimeter in height structural allograft for the C6-7 level . Each was hydrated and saline solution and then gently positioned in the intravertebral disc space and countersunk. We then selected a 68 millimeter in height Dynatran cervical plate. It was positioned over the fusion construct and secured to the vertebra with 4 x 14 mm self-tapping variable screws. Each screw hole was hand drilled and then the screws placed, once all  the screws  were placed, final tightening was performed. The wound was irrigated with bacitracin solution checked for hemostasis which was established and confirmed. An x-ray was taken which showed the upper 3 levels, which showed each of the grafts in good position, and the plate and screws in good position.  Under direct visualization the C6-7 portion of the construct looked good as well. We again confirmed hemostasis. We then proceeded with closure. The platysma was closed with interrupted inverted 2-0 undyed Vicryl suture, the subcutaneous and subcuticular closed with interrupted inverted 3-0 undyed Vicryl suture. The skin edges were approximated with Dermabond. Following surgery the patient was taken out of cervical traction. To be reversed and the anesthetic and taken to the recovery room for further care.  PLAN OF CARE: Admit for overnight observation  PATIENT DISPOSITION:  PACU - hemodynamically stable.   Delay start of Pharmacological VTE agent (>24hrs) due to surgical blood loss or risk of bleeding:  yes

## 2016-10-25 NOTE — Transfer of Care (Signed)
Immediate Anesthesia Transfer of Care Note  Patient: Brian Tran  Procedure(s) Performed: Procedure(s): ANTERIOR CERVICAL DECOMPRESSION/DISCECTOMY FUSION  CERVICAL THREE-CERVICAL FOUR  CERVICAL FOUR-CERVICAL FIVE ,CERVICAL FIVE-CERVICAL SIX ,CERVICAL SIX-CERVICAL SEVEN (N/A)  Patient Location: PACU  Anesthesia Type:General  Level of Consciousness: awake, alert  and oriented  Airway & Oxygen Therapy: Patient Spontanous Breathing and Patient connected to nasal cannula oxygen  Post-op Assessment: Report given to RN, Post -op Vital signs reviewed and stable and Patient moving all extremities  Post vital signs: Reviewed and stable  Last Vitals:  Vitals:   10/25/16 0810 10/25/16 1531  BP: (!) 154/103   Pulse: 83   Temp: 37 C (P) 36.2 C    Last Pain:  Vitals:   10/25/16 0838  TempSrc:   PainSc: 8       Patients Stated Pain Goal: 5 ([id number])  Complications: No apparent anesthesia complications

## 2016-10-25 NOTE — Anesthesia Preprocedure Evaluation (Addendum)
Anesthesia Evaluation  Patient identified by MRN, date of birth, ID band Patient awake    Reviewed: Allergy & Precautions, NPO status , Patient's Chart, lab work & pertinent test results  History of Anesthesia Complications Negative for: history of anesthetic complications  Airway Mallampati: I  TM Distance: >3 FB Neck ROM: Limited    Dental  (+) Missing, Dental Advisory Given,    Pulmonary former smoker,    Pulmonary exam normal breath sounds clear to auscultation       Cardiovascular negative cardio ROS Normal cardiovascular exam Rhythm:Regular     Neuro/Psych Anxiety  Neuromuscular disease    GI/Hepatic Neg liver ROS, GERD  Medicated and Controlled,  Endo/Other  negative endocrine ROS  Renal/GU negative Renal ROS     Musculoskeletal  (+) Arthritis ,   Abdominal Normal abdominal exam  (+)   Peds  Hematology negative hematology ROS (+)   Anesthesia Other Findings   Reproductive/Obstetrics                            Anesthesia Physical Anesthesia Plan  ASA: II  Anesthesia Plan: General   Post-op Pain Management:    Induction: Intravenous  Airway Management Planned: Oral ETT and Video Laryngoscope Planned  Additional Equipment: None  Intra-op Plan:   Post-operative Plan: Extubation in OR  Informed Consent: I have reviewed the patients History and Physical, chart, labs and discussed the procedure including the risks, benefits and alternatives for the proposed anesthesia with the patient or authorized representative who has indicated his/her understanding and acceptance.   Dental advisory given  Plan Discussed with: Surgeon and CRNA  Anesthesia Plan Comments:        Anesthesia Quick Evaluation

## 2016-10-26 DIAGNOSIS — M4802 Spinal stenosis, cervical region: Secondary | ICD-10-CM | POA: Diagnosis not present

## 2016-10-26 MED ORDER — HYDROCODONE-ACETAMINOPHEN 5-325 MG PO TABS: 1.0000 | ORAL_TABLET | ORAL | 0 refills | Status: DC | PRN

## 2016-10-26 NOTE — Discharge Summary (Signed)
Physician Discharge Summary  Patient ID: Brian Tran MRN: [id number] DOB/AGE: 43/30/1977 43 y.o.  Admit date: 10/25/2016 Discharge date: 10/26/2016  Admission Diagnoses:  Cervical stenosis, multilevel cervical disc herniations with myelopathy, cervical spondylosis with myelopathy, cervical degenerative disease  Discharge Diagnoses:  Cervical stenosis, multilevel cervical disc herniations with myelopathy, cervical spondylosis with myelopathy, cervical degenerative disease Active Problems:   HNP (herniated nucleus pulposus), cervical   Discharged Condition: good  Hospital Course: Patient was admitted, underwent a 4 level C3-4, C4-5, C5-6, and C6-7 ACDF. Postoperatively he has done well. He is up and ambulating actively. His incision is healing nicely; there is no swelling, erythema, ecchymosis, or drainage. He is voiding well. He is being discharged home with instructions regarding wound care and activities. He is scheduled to follow with me in the office in 3 weeks.  Discharge Exam: Blood pressure 135/80, pulse 91, temperature 98 F (36.7 C), temperature source Oral, resp. rate 20, weight 94.8 kg (209 lb), SpO2 100 %.  Disposition: 01-Home or Self Care  Discharge Instructions    Discharge wound care:    Complete by:  As directed    Leave the wound open to air. Shower daily with the wound uncovered. Water and soapy water should run over the incision area. Do not wash directly on the incision for 2 weeks. Remove the glue after 2 weeks.   Driving Restrictions    Complete by:  As directed    No driving for 2 weeks. May ride in the car locally now. May begin to drive locally in 2 weeks.   Other Restrictions    Complete by:  As directed    Walk gradually increasing distances out in the fresh air at least twice a day. Walking additional 6 times inside the house, gradually increasing distances, daily. No bending, lifting, or twisting. Perform activities between shoulder and waist height  (that is at counter height when standing or table height when sitting).     Allergies as of 10/26/2016   No Known Allergies     Medication List    STOP taking these medications   diazepam 5 MG tablet Commonly known as:  VALIUM   oxyCODONE-acetaminophen 5-325 MG tablet Commonly known as:  PERCOCET/ROXICET     TAKE these medications   acetaminophen 500 MG tablet Commonly known as:  TYLENOL Take 1,000 mg by mouth every 6 (six) hours as needed (for pain.).   ALPRAZolam 0.5 MG tablet Commonly known as:  XANAX Take 1 mg by mouth at bedtime as needed for anxiety.   HYDROcodone-acetaminophen 5-325 MG tablet Commonly known as:  NORCO/VICODIN Take 1-2 tablets by mouth every 4 (four) hours as needed (pain).   omeprazole 40 MG capsule Commonly known as:  PRILOSEC Take 40 mg by mouth daily as needed (for heartburn/acid reflux.).        SignedHosie Spangle 10/26/2016, 8:32 AM

## 2016-10-26 NOTE — Progress Notes (Signed)
Pt doing well. Pt and wife given D/C instructions with Rx, verbal understanding was provided. Pt's incision is open to air and is clean and dry. Pt's IV was removed prior to D/C. Pt D/C'd home with Aspen collar @ 1015 per MD order. Pt is stable @ D/C and has no other needs at this time. Holli Humbles, RN

## 2016-10-26 NOTE — Discharge Instructions (Signed)

## 2016-10-26 NOTE — Anesthesia Postprocedure Evaluation (Addendum)
Anesthesia Post Note  Patient: Marsel A Leppla  Procedure(s) Performed: Procedure(s) (LRB): ANTERIOR CERVICAL DECOMPRESSION/DISCECTOMY FUSION  CERVICAL THREE-CERVICAL FOUR  CERVICAL FOUR-CERVICAL FIVE ,CERVICAL FIVE-CERVICAL SIX ,CERVICAL SIX-CERVICAL SEVEN (N/A)  Patient location during evaluation: PACU Anesthesia Type: General Level of consciousness: awake Pain management: pain level controlled Vital Signs Assessment: post-procedure vital signs reviewed and stable Respiratory status: spontaneous breathing, respiratory function stable and nonlabored ventilation Cardiovascular status: blood pressure returned to baseline and stable Postop Assessment: no signs of nausea or vomiting Anesthetic complications: no       Last Vitals:  Vitals:   10/26/16 0400 10/26/16 0825  BP: 130/76 135/80  Pulse: 88 91  Resp: 20 20  Temp: 36.8 C 36.7 C    Last Pain:  Vitals:   10/26/16 0825  TempSrc: Oral  PainSc:                  Panayiota Larkin

## 2016-10-27 ENCOUNTER — Encounter (HOSPITAL_COMMUNITY): Payer: Self-pay | Admitting: Neurosurgery

## 2017-02-02 ENCOUNTER — Other Ambulatory Visit: Payer: Self-pay | Admitting: Neurosurgery

## 2017-02-19 NOTE — Addendum Note (Signed)
Addendum  created 02/19/17 1109 by Oleta Mouse, MD   Sign clinical note

## 2017-02-23 NOTE — Pre-Procedure Instructions (Signed)
Brian Tran  02/23/2017      [id number] - [place], [place] - [address] [place] [address] Phone: [phone] Fax: [phone]    Your procedure is scheduled on June 18  Report to [facility] at 830 A.M.  Call this number if you have problems the morning of surgery:  [phone]   Remember:  Do not eat food or drink liquids after midnight.  Take these medicines the morning of surgery with A SIP OF WATER Tylenol if needed, Hydrocodone (Norco) If needed, Omeprazole (Prilosec)  Stop taking aspirin, BC's, Goody's, Herbal medications, Ibuprofen, Advil, Motrin, Aleve, Vitamins   Do not wear jewelry, make-up or nail polish.  Do not wear lotions, powders, or perfumes, or deoderant.  Do not shave 48 hours prior to surgery.  Men may shave face and neck.  Do not bring valuables to the hospital.  [facility] is not responsible for any belongings or valuables.  Contacts, dentures or bridgework may not be worn into surgery.  Leave your suitcase in the car.  After surgery it may be brought to your room.  For patients admitted to the hospital, discharge time will be determined by your treatment team.  Patients discharged the day of surgery will not be allowed to drive home.    Special instructions:  [facility] [facility] - Preparing for Surgery  Before surgery, you can play an important role.  Because skin is not sterile, your skin needs to be as free of germs as possible.  You can reduce the number of germs on you skin by washing with CHG (chlorahexidine gluconate) soap before surgery.  CHG is an antiseptic cleaner which kills germs and bonds with the skin to continue killing germs even after washing.  Please DO NOT use if you have an allergy to CHG or antibacterial soaps.  If your skin becomes reddened/irritated stop using the CHG and inform your nurse when you arrive at Short Stay.  Do not shave  (including legs and underarms) for at least 48 hours prior to the first CHG shower.  You may shave your face.  Please follow these instructions carefully:   1.  Shower with CHG Soap the night before surgery and the  morning of Surgery.  2.  If you choose to wash your hair, wash your hair first as usual with your   normal shampoo.  3.  After you shampoo, rinse your hair and body thoroughly to remove the  Shampoo.  4.  Use CHG as you would any other liquid soap.  You can apply chg directly  to the skin and wash gently with scrungie or a clean washcloth.  5.  Apply the CHG Soap to your body ONLY FROM THE NECK DOWN.   Do not use on open wounds or open sores.  Avoid contact with your eyes,       ears, mouth and genitals (private parts).  Wash genitals (private parts)  with your normal soap.  6.  Wash thoroughly, paying special attention to the area where your surgery  will be performed.  7.  Thoroughly rinse your body with warm water from the neck down.  8.  DO NOT shower/wash with your normal soap after using and rinsing off the CHG Soap.  9.  Pat yourself dry with a clean towel.            10.  Wear clean  pajamas.            11.  Place clean sheets on your bed the night of your first shower and do not sleep with pets.  Day of Surgery  Do not apply any lotions/deoderants the morning of surgery.  Please wear clean clothes to the hospital/surgery center.     Please read over the following fact sheets that you were given. Pain Booklet, Coughing and Deep Breathing, MRSA Information and Surgical Site Infection Prevention

## 2017-02-26 ENCOUNTER — Encounter (HOSPITAL_COMMUNITY): Payer: Self-pay

## 2017-02-26 ENCOUNTER — Encounter (HOSPITAL_COMMUNITY)
Admission: RE | Admit: 2017-02-26 | Discharge: 2017-02-26 | Disposition: A | Discharge status: Home / Self Care | Payer: 59 | Source: Ambulatory Visit | Attending: Neurosurgery | Admitting: Neurosurgery

## 2017-02-26 DIAGNOSIS — Z01812 Encounter for preprocedural laboratory examination: Secondary | ICD-10-CM | POA: Diagnosis not present

## 2017-02-26 LAB — BASIC METABOLIC PANEL
ANION GAP: 9 (ref 5–15)
BUN: 9 mg/dL (ref 6–20)
CALCIUM: 9.4 mg/dL (ref 8.9–10.3)
CHLORIDE: 102 mmol/L (ref 101–111)
CO2: 27 mmol/L (ref 22–32)
Creatinine, Ser: 0.95 mg/dL (ref 0.61–1.24)
GFR calc non Af Amer: >60 mL/min (ref >60)
GLUCOSE: 101 mg/dL — AB (ref 65–99)
Potassium: 4.1 mmol/L (ref 3.5–5.1)
Sodium: 138 mmol/L (ref 135–145)

## 2017-02-26 LAB — TYPE AND SCREEN
ABO/RH(D): O POS
Antibody Screen: NEGATIVE

## 2017-02-26 LAB — CBC
HEMATOCRIT: 50.2 % (ref 39.0–52.0)
HEMOGLOBIN: 17 g/dL (ref 13.0–17.0)
MCH: 32.8 pg (ref 26.0–34.0)
MCHC: 33.9 g/dL (ref 30.0–36.0)
MCV: 96.9 fL (ref 78.0–100.0)
Platelets: 243 10*3/uL (ref 150–400)
RBC: 5.18 MIL/uL (ref 4.22–5.81)
RDW: 13 % (ref 11.5–15.5)
WBC: 7.6 10*3/uL (ref 4.0–10.5)

## 2017-02-26 LAB — SURGICAL PCR SCREEN
MRSA, PCR: NEGATIVE
Staphylococcus aureus: NEGATIVE

## 2017-02-26 NOTE — Progress Notes (Signed)
PCP is Vicenta Aly, FNP  Denies seeing a cardiologist. Denies any chest pain, fever, or cough. Denies ever having a card cath, stress test, or echo.

## 2017-03-04 MED ORDER — CEFAZOLIN SODIUM-DEXTROSE 2-4 GM/100ML-% IV SOLN
2.0000 g | INTRAVENOUS | Status: AC
  Administered 2017-03-05 (×2): 2 g via INTRAVENOUS
  Filled 2017-03-04: qty 100

## 2017-03-05 ENCOUNTER — Inpatient Hospital Stay (HOSPITAL_COMMUNITY)
Admission: RE | Admit: 2017-03-05 | Discharge: 2017-03-06 | DRG: 455 | Disposition: A | Discharge status: Home / Self Care | Payer: 59 | Source: Ambulatory Visit | Attending: Neurosurgery | Admitting: Neurosurgery

## 2017-03-05 ENCOUNTER — Encounter (HOSPITAL_COMMUNITY): Payer: Self-pay | Admitting: *Deleted

## 2017-03-05 ENCOUNTER — Inpatient Hospital Stay (HOSPITAL_COMMUNITY): Payer: 59

## 2017-03-05 ENCOUNTER — Encounter (HOSPITAL_COMMUNITY)
Admission: RE | Disposition: A | Discharge status: Home / Self Care | Payer: Self-pay | Source: Ambulatory Visit | Attending: Neurosurgery

## 2017-03-05 ENCOUNTER — Inpatient Hospital Stay (HOSPITAL_COMMUNITY): Payer: 59 | Admitting: Certified Registered Nurse Anesthetist

## 2017-03-05 DIAGNOSIS — M4726 Other spondylosis with radiculopathy, lumbar region: Secondary | ICD-10-CM | POA: Diagnosis present

## 2017-03-05 DIAGNOSIS — Q7649 Other congenital malformations of spine, not associated with scoliosis: Secondary | ICD-10-CM | POA: Diagnosis present

## 2017-03-05 DIAGNOSIS — M5116 Intervertebral disc disorders with radiculopathy, lumbar region: Secondary | ICD-10-CM | POA: Diagnosis present

## 2017-03-05 DIAGNOSIS — M48061 Spinal stenosis, lumbar region without neurogenic claudication: Secondary | ICD-10-CM | POA: Diagnosis present

## 2017-03-05 DIAGNOSIS — Z419 Encounter for procedure for purposes other than remedying health state, unspecified: Secondary | ICD-10-CM

## 2017-03-05 SURGERY — POSTERIOR LUMBAR FUSION 2 LEVEL
Anesthesia: General | Site: Back

## 2017-03-05 MED ORDER — SODIUM CHLORIDE 0.9% FLUSH: 3.0000 mL | INTRAVENOUS | Status: DC | PRN

## 2017-03-05 MED ORDER — ROCURONIUM BROMIDE 100 MG/10ML IV SOLN
INTRAVENOUS | Status: DC | PRN
  Administered 2017-03-05: 50 mg via INTRAVENOUS

## 2017-03-05 MED ORDER — ACETAMINOPHEN 650 MG RE SUPP: 650.0000 mg | RECTAL | Status: DC | PRN

## 2017-03-05 MED ORDER — DEXAMETHASONE SODIUM PHOSPHATE 10 MG/ML IJ SOLN
INTRAMUSCULAR | Status: AC
  Filled 2017-03-05: qty 1

## 2017-03-05 MED ORDER — HYDROMORPHONE HCL 1 MG/ML IJ SOLN
0.2500 mg | INTRAMUSCULAR | Status: DC | PRN
  Administered 2017-03-05 (×3): 0.5 mg via INTRAVENOUS

## 2017-03-05 MED ORDER — ONDANSETRON HCL 4 MG/2ML IJ SOLN
INTRAMUSCULAR | Status: DC | PRN
  Administered 2017-03-05 (×2): 4 mg via INTRAVENOUS

## 2017-03-05 MED ORDER — PHENOL 1.4 % MT LIQD: 1.0000 | OROMUCOSAL | Status: DC | PRN

## 2017-03-05 MED ORDER — HYDROCODONE-ACETAMINOPHEN 5-325 MG PO TABS
1.0000 | ORAL_TABLET | ORAL | Status: DC | PRN
  Administered 2017-03-05 – 2017-03-06 (×7): 2 via ORAL
  Filled 2017-03-05 (×6): qty 2

## 2017-03-05 MED ORDER — PROPOFOL 10 MG/ML IV BOLUS
INTRAVENOUS | Status: DC | PRN
  Administered 2017-03-05: 200 mg via INTRAVENOUS

## 2017-03-05 MED ORDER — MIDAZOLAM HCL 5 MG/5ML IJ SOLN
INTRAMUSCULAR | Status: DC | PRN
  Administered 2017-03-05: 2 mg via INTRAVENOUS

## 2017-03-05 MED ORDER — SUGAMMADEX SODIUM 200 MG/2ML IV SOLN
INTRAVENOUS | Status: DC | PRN
  Administered 2017-03-05: 200 mg via INTRAVENOUS

## 2017-03-05 MED ORDER — HYDROMORPHONE HCL 1 MG/ML IJ SOLN
INTRAMUSCULAR | Status: AC
  Administered 2017-03-05: 19:00:00
  Filled 2017-03-05: qty 0.5

## 2017-03-05 MED ORDER — PANTOPRAZOLE SODIUM 40 MG PO TBEC: 80.0000 mg | DELAYED_RELEASE_TABLET | Freq: Every day | ORAL | Status: DC | PRN

## 2017-03-05 MED ORDER — LIDOCAINE HCL (CARDIAC) 20 MG/ML IV SOLN
INTRAVENOUS | Status: DC | PRN
  Administered 2017-03-05: 60 mg via INTRAVENOUS

## 2017-03-05 MED ORDER — CYCLOBENZAPRINE HCL 10 MG PO TABS
ORAL_TABLET | ORAL | Status: AC
Start: 2017-03-05 — End: 2017-03-05
  Administered 2017-03-05: 19:00:00
  Filled 2017-03-05: qty 1

## 2017-03-05 MED ORDER — ALUM & MAG HYDROXIDE-SIMETH 200-200-20 MG/5ML PO SUSP: 30.0000 mL | Freq: Four times a day (QID) | ORAL | Status: DC | PRN

## 2017-03-05 MED ORDER — HYDROXYZINE HCL 50 MG/ML IM SOLN: 50.0000 mg | INTRAMUSCULAR | Status: DC | PRN

## 2017-03-05 MED ORDER — FLEET ENEMA 7-19 GM/118ML RE ENEM: 1.0000 | ENEMA | Freq: Once | RECTAL | Status: DC | PRN

## 2017-03-05 MED ORDER — CYCLOBENZAPRINE HCL 5 MG PO TABS
5.0000 mg | ORAL_TABLET | Freq: Three times a day (TID) | ORAL | Status: DC | PRN
Start: 2017-03-05 — End: 2017-03-06
  Administered 2017-03-05 – 2017-03-06 (×3): 10 mg via ORAL
  Filled 2017-03-05 (×2): qty 2

## 2017-03-05 MED ORDER — THROMBIN 5000 UNITS EX SOLR
CUTANEOUS | Status: AC
  Filled 2017-03-05: qty 5000

## 2017-03-05 MED ORDER — HYDROXYZINE HCL 25 MG PO TABS: 50.0000 mg | ORAL_TABLET | ORAL | Status: DC | PRN

## 2017-03-05 MED ORDER — PHENYLEPHRINE 40 MCG/ML (10ML) SYRINGE FOR IV PUSH (FOR BLOOD PRESSURE SUPPORT)
PREFILLED_SYRINGE | INTRAVENOUS | Status: AC
  Filled 2017-03-05: qty 10

## 2017-03-05 MED ORDER — HYDROXYZINE HCL 25 MG PO TABS
50.0000 mg | ORAL_TABLET | ORAL | Status: DC | PRN
  Administered 2017-03-05: 50 mg via ORAL
  Filled 2017-03-05: qty 2

## 2017-03-05 MED ORDER — SUGAMMADEX SODIUM 200 MG/2ML IV SOLN
INTRAVENOUS | Status: AC
  Filled 2017-03-05: qty 2

## 2017-03-05 MED ORDER — ONDANSETRON HCL 4 MG/2ML IJ SOLN
INTRAMUSCULAR | Status: AC
  Filled 2017-03-05: qty 2

## 2017-03-05 MED ORDER — ONDANSETRON HCL 4 MG PO TABS: 4.0000 mg | ORAL_TABLET | Freq: Four times a day (QID) | ORAL | Status: DC | PRN

## 2017-03-05 MED ORDER — ONDANSETRON HCL 4 MG/2ML IJ SOLN: 4.0000 mg | Freq: Four times a day (QID) | INTRAMUSCULAR | Status: DC | PRN

## 2017-03-05 MED ORDER — EPHEDRINE SULFATE 50 MG/ML IJ SOLN
INTRAMUSCULAR | Status: DC | PRN
  Administered 2017-03-05: 10 mg via INTRAVENOUS

## 2017-03-05 MED ORDER — BISACODYL 10 MG RE SUPP: 10.0000 mg | Freq: Every day | RECTAL | Status: DC | PRN

## 2017-03-05 MED ORDER — BUPIVACAINE HCL (PF) 0.5 % IJ SOLN
INTRAMUSCULAR | Status: DC | PRN
  Administered 2017-03-05: 30 mL

## 2017-03-05 MED ORDER — MORPHINE SULFATE (PF) 4 MG/ML IV SOLN
4.0000 mg | INTRAVENOUS | Status: DC | PRN
Start: 2017-03-05 — End: 2017-03-07
  Administered 2017-03-05 – 2017-03-06 (×3): 4 mg via INTRAMUSCULAR
  Filled 2017-03-05 (×3): qty 1

## 2017-03-05 MED ORDER — PROPOFOL 10 MG/ML IV BOLUS
INTRAVENOUS | Status: AC
  Filled 2017-03-05: qty 20

## 2017-03-05 MED ORDER — LIDOCAINE 2% (20 MG/ML) 5 ML SYRINGE
INTRAMUSCULAR | Status: AC
  Filled 2017-03-05: qty 5

## 2017-03-05 MED ORDER — LIDOCAINE-EPINEPHRINE 1 %-1:100000 IJ SOLN
INTRAMUSCULAR | Status: DC | PRN
  Administered 2017-03-05: 30 mL

## 2017-03-05 MED ORDER — KETOROLAC TROMETHAMINE 30 MG/ML IJ SOLN
30.0000 mg | Freq: Once | INTRAMUSCULAR | Status: AC
  Administered 2017-03-05: 30 mg via INTRAVENOUS

## 2017-03-05 MED ORDER — ROCURONIUM BROMIDE 10 MG/ML (PF) SYRINGE
PREFILLED_SYRINGE | INTRAVENOUS | Status: AC
  Filled 2017-03-05: qty 5

## 2017-03-05 MED ORDER — SODIUM CHLORIDE 0.9% FLUSH
3.0000 mL | Freq: Two times a day (BID) | INTRAVENOUS | Status: DC
  Administered 2017-03-06: 3 mL via INTRAVENOUS

## 2017-03-05 MED ORDER — SODIUM CHLORIDE 0.9 % IV SOLN: 250.0000 mL | INTRAVENOUS | Status: DC

## 2017-03-05 MED ORDER — CHLORHEXIDINE GLUCONATE CLOTH 2 % EX PADS: 6.0000 | MEDICATED_PAD | Freq: Once | CUTANEOUS | Status: DC

## 2017-03-05 MED ORDER — MIDAZOLAM HCL 2 MG/2ML IJ SOLN
INTRAMUSCULAR | Status: AC
  Filled 2017-03-05: qty 2

## 2017-03-05 MED ORDER — PHENYLEPHRINE HCL 10 MG/ML IJ SOLN
INTRAMUSCULAR | Status: DC | PRN
  Administered 2017-03-05: 40 ug via INTRAVENOUS
  Administered 2017-03-05: 80 ug via INTRAVENOUS

## 2017-03-05 MED ORDER — MAGNESIUM HYDROXIDE 400 MG/5ML PO SUSP: 30.0000 mL | Freq: Every day | ORAL | Status: DC | PRN

## 2017-03-05 MED ORDER — HYDROMORPHONE HCL 1 MG/ML IJ SOLN
INTRAMUSCULAR | Status: AC
  Administered 2017-03-05: 20:00:00
  Filled 2017-03-05: qty 0.5

## 2017-03-05 MED ORDER — ACETAMINOPHEN 10 MG/ML IV SOLN
INTRAVENOUS | Status: DC | PRN
  Administered 2017-03-05: 1000 mg via INTRAVENOUS

## 2017-03-05 MED ORDER — KETOROLAC TROMETHAMINE 30 MG/ML IJ SOLN
30.0000 mg | Freq: Four times a day (QID) | INTRAMUSCULAR | Status: DC
  Administered 2017-03-06 (×4): 30 mg via INTRAVENOUS
  Filled 2017-03-05 (×4): qty 1

## 2017-03-05 MED ORDER — KETOROLAC TROMETHAMINE 30 MG/ML IJ SOLN
INTRAMUSCULAR | Status: AC
  Filled 2017-03-05: qty 1

## 2017-03-05 MED ORDER — EPHEDRINE 5 MG/ML INJ
INTRAVENOUS | Status: AC
  Filled 2017-03-05: qty 10

## 2017-03-05 MED ORDER — CEFAZOLIN SODIUM 1 G IJ SOLR
INTRAMUSCULAR | Status: AC
  Filled 2017-03-05: qty 20

## 2017-03-05 MED ORDER — FENTANYL CITRATE (PF) 100 MCG/2ML IJ SOLN
50.0000 ug | Freq: Once | INTRAMUSCULAR | Status: AC
  Administered 2017-03-05: 50 ug via INTRAVENOUS

## 2017-03-05 MED ORDER — FENTANYL CITRATE (PF) 100 MCG/2ML IJ SOLN
INTRAMUSCULAR | Status: AC
  Administered 2017-03-05: 50 ug
  Filled 2017-03-05: qty 2

## 2017-03-05 MED ORDER — ACETAMINOPHEN 10 MG/ML IV SOLN
INTRAVENOUS | Status: AC
  Filled 2017-03-05: qty 100

## 2017-03-05 MED ORDER — FENTANYL CITRATE (PF) 250 MCG/5ML IJ SOLN
INTRAMUSCULAR | Status: AC
  Filled 2017-03-05: qty 5

## 2017-03-05 MED ORDER — THROMBIN 20000 UNITS EX SOLR
CUTANEOUS | Status: AC
  Filled 2017-03-05: qty 20000

## 2017-03-05 MED ORDER — HYDROCODONE-ACETAMINOPHEN 5-325 MG PO TABS
ORAL_TABLET | ORAL | Status: AC
  Administered 2017-03-05: 19:00:00
  Filled 2017-03-05: qty 2

## 2017-03-05 MED ORDER — 0.9 % SODIUM CHLORIDE (POUR BTL) OPTIME
TOPICAL | Status: DC | PRN
  Administered 2017-03-05 (×2): 1000 mL

## 2017-03-05 MED ORDER — MENTHOL 3 MG MT LOZG: 1.0000 | LOZENGE | OROMUCOSAL | Status: DC | PRN

## 2017-03-05 MED ORDER — ACETAMINOPHEN 325 MG PO TABS: 650.0000 mg | ORAL_TABLET | ORAL | Status: DC | PRN

## 2017-03-05 MED ORDER — FENTANYL CITRATE (PF) 100 MCG/2ML IJ SOLN
INTRAMUSCULAR | Status: DC | PRN
  Administered 2017-03-05: 100 ug via INTRAVENOUS
  Administered 2017-03-05: 150 ug via INTRAVENOUS
  Administered 2017-03-05 (×3): 50 ug via INTRAVENOUS

## 2017-03-05 MED ORDER — THROMBIN 20000 UNITS EX SOLR
CUTANEOUS | Status: DC | PRN
  Administered 2017-03-05: 13:00:00 via TOPICAL

## 2017-03-05 MED ORDER — ONDANSETRON HCL 4 MG/2ML IJ SOLN
INTRAMUSCULAR | Status: AC
  Filled 2017-03-05: qty 4

## 2017-03-05 MED ORDER — HYDROMORPHONE HCL 1 MG/ML IJ SOLN
INTRAMUSCULAR | Status: AC
  Filled 2017-03-05: qty 0.5

## 2017-03-05 MED ORDER — METHYLPREDNISOLONE ACETATE 80 MG/ML IJ SUSP
INTRAMUSCULAR | Status: AC
  Filled 2017-03-05: qty 1

## 2017-03-05 MED ORDER — LACTATED RINGERS IV SOLN
INTRAVENOUS | Status: DC
  Administered 2017-03-05 (×4): via INTRAVENOUS

## 2017-03-05 MED ORDER — KCL IN DEXTROSE-NACL 20-5-0.45 MEQ/L-%-% IV SOLN: INTRAVENOUS | Status: DC

## 2017-03-05 SURGICAL SUPPLY — 76 items
BAG DECANTER FOR FLEXI CONT (MISCELLANEOUS) ×2 IMPLANT
BENZOIN TINCTURE PRP APPL 2/3 (GAUZE/BANDAGES/DRESSINGS) IMPLANT
BLADE CLIPPER SURG (BLADE) IMPLANT
BUR ACRON 5.0MM COATED (BURR) ×2 IMPLANT
BUR MATCHSTICK NEURO 3.0 LAGG (BURR) ×2 IMPLANT
CANISTER SUCT 3000ML PPV (MISCELLANEOUS) ×2 IMPLANT
CAP LCK SPNE (Orthopedic Implant) ×6 IMPLANT
CAP LOCK SPINE RADIUS (Orthopedic Implant) ×6 IMPLANT
CAP LOCKING (Orthopedic Implant) ×6 IMPLANT
CARTRIDGE OIL MAESTRO DRILL (MISCELLANEOUS) ×1 IMPLANT
CONT SPEC 4OZ CLIKSEAL STRL BL (MISCELLANEOUS) ×2 IMPLANT
COVER BACK TABLE 60X90IN (DRAPES) ×2 IMPLANT
DERMABOND ADVANCED (GAUZE/BANDAGES/DRESSINGS) ×1
DERMABOND ADVANCED .7 DNX12 (GAUZE/BANDAGES/DRESSINGS) ×1 IMPLANT
DIFFUSER DRILL AIR PNEUMATIC (MISCELLANEOUS) ×2 IMPLANT
DRAPE C-ARM 42X72 X-RAY (DRAPES) ×4 IMPLANT
DRAPE C-ARMOR (DRAPES) IMPLANT
DRAPE HALF SHEET 40X57 (DRAPES) IMPLANT
DRAPE LAPAROTOMY 100X72X124 (DRAPES) ×2 IMPLANT
DRAPE POUCH INSTRU U-SHP 10X18 (DRAPES) ×2 IMPLANT
ELECT REM PT RETURN 9FT ADLT (ELECTROSURGICAL) ×2
ELECTRODE REM PT RTRN 9FT ADLT (ELECTROSURGICAL) ×1 IMPLANT
GAUZE SPONGE 4X4 12PLY STRL (GAUZE/BANDAGES/DRESSINGS) ×2 IMPLANT
GAUZE SPONGE 4X4 16PLY XRAY LF (GAUZE/BANDAGES/DRESSINGS) ×2 IMPLANT
GLOVE BIOGEL PI IND STRL 7.0 (GLOVE) ×2 IMPLANT
GLOVE BIOGEL PI IND STRL 7.5 (GLOVE) ×4 IMPLANT
GLOVE BIOGEL PI IND STRL 8 (GLOVE) ×2 IMPLANT
GLOVE BIOGEL PI INDICATOR 7.0 (GLOVE) ×2
GLOVE BIOGEL PI INDICATOR 7.5 (GLOVE) ×4
GLOVE BIOGEL PI INDICATOR 8 (GLOVE) ×2
GLOVE ECLIPSE 7.5 STRL STRAW (GLOVE) ×4 IMPLANT
GLOVE INDICATOR 7.0 STRL GRN (GLOVE) ×2 IMPLANT
GLOVE INDICATOR 7.5 STRL GRN (GLOVE) ×2 IMPLANT
GLOVE SS BIOGEL STRL SZ 7 (GLOVE) ×1 IMPLANT
GLOVE SUPERSENSE BIOGEL SZ 7 (GLOVE) ×1
GLOVE SURG SS PI 7.5 STRL IVOR (GLOVE) ×2 IMPLANT
GOWN STRL REUS W/ TWL LRG LVL3 (GOWN DISPOSABLE) ×1 IMPLANT
GOWN STRL REUS W/ TWL XL LVL3 (GOWN DISPOSABLE) ×2 IMPLANT
GOWN STRL REUS W/TWL 2XL LVL3 (GOWN DISPOSABLE) IMPLANT
GOWN STRL REUS W/TWL LRG LVL3 (GOWN DISPOSABLE) ×1
GOWN STRL REUS W/TWL XL LVL3 (GOWN DISPOSABLE) ×2
KIT BASIN OR (CUSTOM PROCEDURE TRAY) ×2 IMPLANT
KIT INFUSE SMALL (Orthopedic Implant) ×2 IMPLANT
KIT ROOM TURNOVER OR (KITS) ×2 IMPLANT
NEEDLE 18GX1X1/2 (RX/OR ONLY) (NEEDLE) ×2 IMPLANT
NEEDLE ASP BONE MRW 8GX15 (NEEDLE) ×2 IMPLANT
NEEDLE SPNL 18GX3.5 QUINCKE PK (NEEDLE) ×2 IMPLANT
NEEDLE SPNL 22GX3.5 QUINCKE BK (NEEDLE) ×2 IMPLANT
NS IRRIG 1000ML POUR BTL (IV SOLUTION) ×4 IMPLANT
OIL CARTRIDGE MAESTRO DRILL (MISCELLANEOUS) ×2
PACK LAMINECTOMY NEURO (CUSTOM PROCEDURE TRAY) ×2 IMPLANT
PAD ARMBOARD 7.5X6 YLW CONV (MISCELLANEOUS) ×6 IMPLANT
PATTIES SURGICAL .5 X.5 (GAUZE/BANDAGES/DRESSINGS) ×2 IMPLANT
PATTIES SURGICAL .5 X1 (DISPOSABLE) IMPLANT
PATTIES SURGICAL 1X1 (DISPOSABLE) ×2 IMPLANT
PEEK PLIF AVS 10X25X4 (Peek) ×8 IMPLANT
ROD 5.5X60MM PURPLE (Rod) ×4 IMPLANT
SCREW 5.75X40M (Screw) ×12 IMPLANT
SPONGE LAP 4X18 X RAY DECT (DISPOSABLE) IMPLANT
SPONGE NEURO XRAY DETECT 1X3 (DISPOSABLE) IMPLANT
SPONGE SURGIFOAM ABS GEL 100 (HEMOSTASIS) ×2 IMPLANT
STAPLER SKIN PROX WIDE 3.9 (STAPLE) ×2 IMPLANT
STRIP BIOACTIVE VITOSS 25X100X (Neuro Prosthesis/Implant) ×4 IMPLANT
STRIP CLOSURE SKIN 1/2X4 (GAUZE/BANDAGES/DRESSINGS) IMPLANT
SUT PROLENE 6 0 BV (SUTURE) IMPLANT
SUT VIC AB 1 CT1 18XBRD ANBCTR (SUTURE) ×2 IMPLANT
SUT VIC AB 1 CT1 8-18 (SUTURE) ×2
SUT VIC AB 2-0 CP2 18 (SUTURE) ×4 IMPLANT
SYR 3ML LL SCALE MARK (SYRINGE) IMPLANT
SYR 5ML LL (SYRINGE) IMPLANT
SYR CONTROL 10ML LL (SYRINGE) ×2 IMPLANT
TOWEL GREEN STERILE (TOWEL DISPOSABLE) ×2 IMPLANT
TOWEL GREEN STERILE FF (TOWEL DISPOSABLE) ×2 IMPLANT
TRAP SPECIMEN MUCOUS 40CC (MISCELLANEOUS) IMPLANT
TRAY FOLEY W/METER SILVER 16FR (SET/KITS/TRAYS/PACK) ×2 IMPLANT
WATER STERILE IRR 1000ML POUR (IV SOLUTION) ×2 IMPLANT

## 2017-03-05 NOTE — Anesthesia Postprocedure Evaluation (Signed)
Anesthesia Post Note  Patient: Brian Tran  Procedure(s) Performed: Procedure(s) (LRB): L3-L4 & L4-L5 lumbar decompression, PLIF, PLA (N/A)     Patient location during evaluation: PACU Anesthesia Type: General Level of consciousness: awake and alert Pain management: pain level controlled Vital Signs Assessment: post-procedure vital signs reviewed and stable Respiratory status: spontaneous breathing, nonlabored ventilation, respiratory function stable and patient connected to nasal cannula oxygen Cardiovascular status: blood pressure returned to baseline and stable Postop Assessment: no signs of nausea or vomiting Anesthetic complications: no    Last Vitals:  Vitals:   03/05/17 1920 03/05/17 1935  BP: 120/73 122/75  Pulse: 91 88  Resp: 16 13  Temp:  36.4 C    Last Pain:  Vitals:   03/05/17 1935  TempSrc:   PainSc: Tyler Deis

## 2017-03-05 NOTE — Transfer of Care (Signed)
Immediate Anesthesia Transfer of Care Note  Patient: Brian Tran  Procedure(s) Performed: Procedure(s): L3-L4 & L4-L5 lumbar decompression, PLIF, PLA (N/A)  Patient Location: PACU  Anesthesia Type:General  Level of Consciousness: awake, alert  and oriented  Airway & Oxygen Therapy: Patient Spontanous Breathing and Patient connected to nasal cannula oxygen  Post-op Assessment: Report given to RN, Post -op Vital signs reviewed and stable and Patient moving all extremities  Post vital signs: Reviewed and stable  Last Vitals:  Vitals:   03/05/17 0911 03/05/17 1820  BP: 132/86 125/88  Pulse:  99  Resp:  14  Temp:  37.3 C    Last Pain:  Vitals:   03/05/17 1139  TempSrc:   PainSc: 6       Patients Stated Pain Goal: 3 (95/74/73 4037)  Complications: No apparent anesthesia complications

## 2017-03-05 NOTE — Progress Notes (Signed)
Patient still complaining of 6/10 pain.  Per Dr. Ola Spurr, will give another 50 mcg of Fentanyl.

## 2017-03-05 NOTE — H&P (Signed)
Subjective: Patient is a 41 y.o. right-handed white male who is admitted for treatment of disabling right-sided low back and right lumbar radicular pain.  Patient is a little over 4 months status post a 4 level C3-4, C4-5, C5-6, and C6-7 ACDF with structural allograft and cervical plating. He has recovered sufficiently to proceed with his lumbar surgery. Symptoms of right-sided low back and right lumbar disc pain began in September 2017 when he picked up a piece of luggage. He was treated with multiple steroid dose packs, physical therapy, numerous spinal injections, but the pain has persisted. 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 There is a broad-based disc herniation at the L3-4 level, worse the left than to the right. Parasagittal images showed neural foraminal stenosis on the right at both L3-4 and L4-5. He is admitted now for bilateral L3-4 and L4-5 lumbar decompression and stabilization. Specifically we are planning on bilateral L3-4 lumbar decompression including laminectomy, facetectomy, and foraminotomy, and stabilization via a bilateral L3-4 and L4-5 posterior lumbar interbody arthrodesis with interbody implants and bone graft and a bilateral L3-L5 posterior lateral arthrodesis with posterior instrumentation and bone graft.   Patient Active Problem List   Diagnosis Date Noted  . HNP (herniated nucleus pulposus), cervical 10/25/2016   Past Medical History:  Diagnosis Date  . Anxiety   . DDD (degenerative disc disease), cervical   . GERD (gastroesophageal reflux disease)   . Neuromuscular disorder ([place])    right ulnar nerve pain  . Seasonal allergies     Past Surgical History:  Procedure Laterality Date  . ANTERIOR CERVICAL DECOMPRESSION/DISCECTOMY FUSION 4 LEVELS N/A 10/25/2016   Procedure: ANTERIOR CERVICAL DECOMPRESSION/DISCECTOMY  FUSION  CERVICAL THREE-CERVICAL FOUR  CERVICAL FOUR-CERVICAL FIVE ,CERVICAL FIVE-CERVICAL SIX ,CERVICAL SIX-CERVICAL SEVEN;  Surgeon: Jovita Gamma, MD;  Location: [facility];  Service: Neurosurgery;  Laterality: N/A;  . BRONCHOSCOPY    . HERNIA REPAIR Right    inguinal hernia  . WISDOM TOOTH EXTRACTION      Prescriptions Prior to Admission  Medication Sig Dispense Refill Last Dose  . acetaminophen (TYLENOL) 500 MG tablet Take 1,000 mg by mouth every 8 (eight) hours as needed for mild pain or headache.    03/04/2017 at Unknown time  . omeprazole (PRILOSEC) 40 MG capsule Take 40 mg by mouth daily as needed (for heartburn/acid reflux.).   03/04/2017 at Unknown time  . HYDROcodone-acetaminophen (NORCO/VICODIN) 5-325 MG tablet Take 1-2 tablets by mouth every 4 (four) hours as needed (pain). (Patient not taking: Reported on 02/21/2017) 60 tablet 0 Not Taking at Unknown time   Allergies  Allergen Reactions  . No Known Allergies     Social History  Substance Use Topics  . Smoking status: Former Smoker    Quit date: 10/24/1996  . Smokeless tobacco: Current User    Types: Snuff  . Alcohol use Yes     Comment: occassional    Family History  Problem Relation Age of Onset  . Kidney cancer Father   . COPD Father   . Hypertension Father      Review of Systems A comprehensive review of systems was negative.  Objective: Vital signs in last 24 hours: Temp:  [98.5 F (36.9 C)] 98.5 F (36.9 C) (06/18 0906) Pulse Rate:  [80] 80 (06/18 0906) Resp:  [20] 20 (06/18 0906) BP: (132)/(86) 132/86 (06/18 0911) SpO2:  [98 %] 98 % (  06/18 0906) Weight:  [93.4 kg (206 lb)] 93.4 kg (206 lb) (06/18 0906)  EXAM: Patient well-developed well-nourished white male in no acute distress. Lungs are clear to auscultation , the patient has symmetrical respiratory excursion. Heart has a regular rate and rhythm normal S1 and S2 no murmur.   Abdomen is soft nontender nondistended bowel sounds are present. Extremity  examination shows no clubbing cyanosis or edema. Neurologic examination shows the left lower extremity strength is 5/5 including the iliopsoas, quadriceps, dorsi flexor, EHL, and plantar flexor. However the right iliopsoas and quadriceps are at least 4-4+/5, but he gives way, seemingly due to pain, although we cannot exclude some neurologic deficit. Distal right lower extremity strength is 5/5 in the dorsal flexor, EHL, and plantar flexor. Sensation is intact to pinprick the distal lower extremities. Reflexes are 1 at the quadriceps, 2 at the gastrocnemius, they are symmetrical bilaterally, toes are downgoing bilaterally. Gait and stance favor the right lower extremity.  Data Review:CBC    Component Value Date/Time   WBC 7.6 02/26/2017 0918   RBC 5.18 02/26/2017 0918   HGB 17.0 02/26/2017 0918   HCT 50.2 02/26/2017 0918   PLT 243 02/26/2017 0918   MCV 96.9 02/26/2017 0918   MCH 32.8 02/26/2017 0918   MCHC 33.9 02/26/2017 0918   RDW 13.0 02/26/2017 0918                          BMET    Component Value Date/Time   NA 138 02/26/2017 0918   K 4.1 02/26/2017 0918   CL 102 02/26/2017 0918   CO2 27 02/26/2017 0918   GLUCOSE 101 (H) 02/26/2017 0918   BUN 9 02/26/2017 0918   CREATININE 0.95 02/26/2017 0918   CALCIUM 9.4 02/26/2017 0918   GFRNONAA >60 02/26/2017 0918   GFRAA >60 02/26/2017 0918     Assessment/Plan: Patient with persistent right-sided low back and right lumbar radicular pain, is admitted for decompression and stabilization at the L3-4 and L4-5 levels.  I've discussed with the patient the nature of his condition, the nature the surgical procedure, the typical length of surgery, hospital stay, and overall recuperation, the limitations postoperatively, and risks of surgery. I discussed risks including risks of infection, bleeding, possibly need for transfusion, the risk of nerve root dysfunction with pain, weakness, numbness, or paresthesias, the risk of dural tear and CSF  leakage and possible need for further surgery, the risk of failure of the arthrodesis and possibly for further surgery, the risk of anesthetic complications including myocardial infarction, stroke, pneumonia, and death. We discussed the need for postoperative immobilization in a lumbar brace. Understanding all this the patient does wish to proceed with surgery and is admitted for such.     Hosie Spangle, MD 03/05/2017 11:43 AM

## 2017-03-05 NOTE — Progress Notes (Signed)
Patient complaining of 10/10 headache.  Dr. Ola Spurr notified.  Received verbal order to give 50-100 mcg Fentanyl.  Will continue to monitor.

## 2017-03-05 NOTE — Op Note (Signed)
03/05/2017  6:09 PM  PATIENT:  Brian Tran  41 y.o. 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, L3-4 lumbar disc herniation, right L3-4 and L4-5 neural foraminal stenosis, lumbar spondylosis, lumbar degenerative disease, lumbar radiculopathy  PROCEDURE:  Procedure(s):  Bilateral L3-4 and L4-5 lumbar decompression including laminectomy, facetectomy, foraminotomy, with decompression of the neural foraminal stenosis, with decompression beyond that required for interbody arthrodesis; bilateral L3-4 and L4-5 posterior lumbar interbody arthrodesis with AVS peek interbody implants, Vitoss BA with bone marrow aspirate, and infuse; bilateral L3-L5 posterior lateral arthrodesis with segmental radius posterior instrumentation, Vitoss BA with bone marrow aspirate, and infuse  SURGEON:  Jovita Gamma, M.D.  ASSISTANTS: Cyndy Freeze, M.D.  ANESTHESIA:   general  EBL:  Total I/O In: 2000 [I.V.:2000] Out: 1450 [Urine:1050; Blood:400]  BLOOD ADMINISTERED:none  CELL SAVER GIVEN: Cell Saver technician felt that there was insufficient blood to return to the patient.  COUNT: Correct per nursing staff  DICTATION: Patient was brought to the operating room placed under general endotracheal anesthesia. The patient was turned to prone position, the lumbar region was prepped with Betadine soap and solution and draped in a sterile fashion. The midline was infiltrated with local anesthesia with epinephrine. A localizing x-ray was taken and then a midline incision was made and carried down through the subcutaneous tissue, bipolar cautery and electrocautery  were used to maintain hemostasis. Dissection was carried down to the lumbar fascia. The fascia was incised bilaterally and the paraspinal muscles were dissected with a spinous process and lamina in a subperiosteal fashion. Another x-ray was taken for localization and the L3-4 and L4-5 levels were localized. Dissection was then carried out laterally over the facet complexes. The markedly hypertrophic and dysmorphic right L4-5 facet joint was exposed.  Loose fragments of bone were mobilized from the right L4-5 facet joint.  We then proceeded with the decompression performing bilateral L3-4 and L4-5 laminotomies, using the high-speed drill and Kerrison punches. Dissection was carried out laterally, and bilateral L3-4 and L4-5 facetectomies performed. We identified the thecal sac and exiting L3, L4, and L5 nerve roots bilaterally. Foraminotomies were performed for the exiting nerve roots. To fully decompress the right L4 nerve root, required the discectomy at that level, as described below. In the end, good decompression of the stenotic compression of the exiting L3, L4, and L5 nerve roots bilaterally was achieved.  Once the decompression of the stenotic compression of the thecal sac and exiting nerve roots was completed we proceeded with the posterior lumbar interbody arthrodesis. The annulus at each level was incised bilaterally and the disc space entered. A thorough discectomy was performed using pituitary rongeurs and curettes. As the discectomy was performed, we're able to decompress the ventral aspect of the foraminal stenosis. Once the discectomy was completed we began to prepare the endplate surfaces, removing the cartilaginous endplates surface. We then measured the height of the intervertebral disc space. We selected 10 x 25 x 4 AVS peek interbody implants for the L3-4 level, and 10 x 25 x 4 AVS peek interbody implants for the L4-5 level.  The C-arm fluoroscope was then draped and brought in the field  and we identified the pedicle entry points bilaterally at the L3, L4,  and L5 levels. Each of the 6 pedicles was probed, we aspirated bone marrow aspirate from the vertebral bodies, this was injected over two 10 cc strips of Vitoss BA. Then each of the pedicles was examined with the ball probe, good bony surfaces were found and no bony cuts were found. Each of the pedicles was then tapped, first with a 4.25 mm tap, examined with the ball probe with good threading being found and no bony cuts, and then over tapped with a 5.25 mm tap, again examined with the ball probe good threading was found and no bony cuts were found. We then placed 5.75 by 40 millimeter screws bilaterally at the L3 level, 5.75 by 40 millimeter screws bilaterally at the L4 level, and 5.75 by 40 millimeter screws bilaterally at the L5 level.  We then packed the AVS peek interbody implants with Vitoss BA with bone marrow aspirate and infuse, and then placed the first implant at the L3-4 level on the right side, carefully retracting the thecal sac and nerve root medially. We then went back to the left side and packed the midline with additional Vitoss BA with bone marrow aspirate and infuse, and then placed a second implant on the left side again retracting the thecal sac and nerve root medially.  Then at the L4-5 level, we placed the first implant on the right side, carefully retracting the thecal sac and nerve root medially. We then went back to the left side and packed the midline with additional Vitoss BA with bone marrow aspirate and infuse, and then placed a second implant on the left side, again retracting the thecal sac and nerve root medially.   We then packed the lateral gutter over the transverse processes and intertransverse space with Vitoss BA with bone marrow aspirate and infuse. We then selected 60 mm hyperlordosed rods, they were placed within the screw heads and secured with locking caps once all 6 locking caps were placed final  tightening was performed against a counter torque.  The wound had been irrigated multiple times during the procedure with saline solution and bacitracin solution, good hemostasis was established with a combination of bipolar cautery and Gelfoam with thrombin. Once good hemostasis was confirmed we proceeded with closure paraspinal muscles deep fascia and Scarpa's fascia were closed with interrupted undyed 1 Vicryl sutures the subcutaneous and subcuticular closed with interrupted inverted 2-0 undyed Vicryl sutures the skin edges were approximated with Dermabond. The wound was dressed with sterile gauze and Hypafix.  Following surgery the patient was turned back to the supine position to be reversed and the anesthetic extubated and transferred to the recovery room for further care.    PLAN OF CARE: Admit to inpatient   PATIENT DISPOSITION:  PACU - hemodynamically stable.   Delay start of Pharmacological VTE agent (>24hrs) due to surgical blood loss or risk of bleeding:  yes

## 2017-03-05 NOTE — Anesthesia Preprocedure Evaluation (Signed)
Anesthesia Evaluation  Patient identified by MRN, date of birth, ID band Patient awake    Reviewed: Allergy & Precautions, H&P , NPO status , Patient's Chart, lab work & pertinent test results  Airway Mallampati: II  TM Distance: >3 FB Neck ROM: Full    Dental no notable dental hx. (+) Teeth Intact, Dental Advisory Given   Pulmonary neg pulmonary ROS, former smoker,    Pulmonary exam normal breath sounds clear to auscultation       Cardiovascular negative cardio ROS   Rhythm:Regular Rate:Normal     Neuro/Psych Anxiety negative neurological ROS  negative psych ROS   GI/Hepatic Neg liver ROS, GERD  Medicated and Controlled,  Endo/Other  negative endocrine ROS  Renal/GU negative Renal ROS  negative genitourinary   Musculoskeletal  (+) Arthritis , Osteoarthritis,    Abdominal   Peds  Hematology negative hematology ROS (+)   Anesthesia Other Findings   Reproductive/Obstetrics negative OB ROS                             Anesthesia Physical Anesthesia Plan  ASA: II  Anesthesia Plan: General   Post-op Pain Management:    Induction: Intravenous  PONV Risk Score and Plan: 3 and Ondansetron, Dexamethasone, Propofol and Midazolam  Airway Management Planned: Oral ETT  Additional Equipment:   Intra-op Plan:   Post-operative Plan: Extubation in OR  Informed Consent: I have reviewed the patients History and Physical, chart, labs and discussed the procedure including the risks, benefits and alternatives for the proposed anesthesia with the patient or authorized representative who has indicated his/her understanding and acceptance.   Dental advisory given  Plan Discussed with: CRNA  Anesthesia Plan Comments:         Anesthesia Quick Evaluation

## 2017-03-05 NOTE — Anesthesia Procedure Notes (Signed)
Procedure Name: Intubation Date/Time: 03/05/2017 12:31 PM Performed by: Ollen Bowl Pre-anesthesia Checklist: Patient identified, Emergency Drugs available, Suction available, Patient being monitored and Timeout performed Patient Re-evaluated:Patient Re-evaluated prior to inductionOxygen Delivery Method: Circle system utilized Preoxygenation: Pre-oxygenation with 100% oxygen Intubation Type: IV induction Ventilation: Mask ventilation without difficulty Laryngoscope Size: Mac and 4 Grade View: Grade I Tube type: Oral Tube size: 7.5 mm Number of attempts: 1 Airway Equipment and Method: Stylet Placement Confirmation: ETT inserted through vocal cords under direct vision,  positive ETCO2 and breath sounds checked- equal and bilateral Secured at: 22 cm Tube secured with: Tape Dental Injury: Teeth and Oropharynx as per pre-operative assessment

## 2017-03-05 NOTE — Progress Notes (Signed)
Vitals:   03/05/17 0911 03/05/17 1820 03/05/17 1835 03/05/17 1837  BP: 132/86 125/88 128/73 128/73  Pulse:  99 94 98  Resp:  14 19 (!) 22  Temp:  99.2 F (37.3 C)    TempSrc:      SpO2:  98% 94% 97%  Weight:        Patient resting comfortably in PACU. Following commands. Steadily becoming more alert. Moving all 4 extremity is well. Foley to straight drainage.  Plan: To transfer soon to [facility]. We'll ambulate later, if alert enough. Continue to progress through postoperative recovery.  Hosie Spangle, MD 03/05/2017, 6:50 PM

## 2017-03-06 MED ORDER — CYCLOBENZAPRINE HCL 10 MG PO TABS: 10.0000 mg | ORAL_TABLET | Freq: Three times a day (TID) | ORAL | Status: DC | PRN

## 2017-03-06 MED ORDER — HYDROCODONE-ACETAMINOPHEN 7.5-325 MG PO TABS: 1.0000 | ORAL_TABLET | ORAL | Status: DC | PRN

## 2017-03-06 MED ORDER — HYDROCODONE-ACETAMINOPHEN 7.5-325 MG PO TABS: 1.0000 | ORAL_TABLET | ORAL | 0 refills | Status: DC | PRN

## 2017-03-06 MED ORDER — HYDROCODONE-ACETAMINOPHEN 5-325 MG PO TABS: 1.0000 | ORAL_TABLET | ORAL | Status: DC | PRN

## 2017-03-06 MED ORDER — CYCLOBENZAPRINE HCL 10 MG PO TABS: 5.0000 mg | ORAL_TABLET | Freq: Three times a day (TID) | ORAL | 1 refills | Status: DC | PRN

## 2017-03-06 MED ORDER — HYDROCODONE-ACETAMINOPHEN 5-325 MG PO TABS: 1.0000 | ORAL_TABLET | ORAL | 0 refills | Status: DC | PRN

## 2017-03-06 MED FILL — Sodium Chloride IV Soln 0.9%: INTRAVENOUS | Qty: 1000 | Status: AC

## 2017-03-06 MED FILL — Heparin Sodium (Porcine) Inj 1000 Unit/ML: INTRAMUSCULAR | Qty: 30 | Status: AC

## 2017-03-06 NOTE — Discharge Instructions (Signed)

## 2017-03-06 NOTE — Discharge Summary (Signed)
Physician Discharge Summary  Patient ID: Brian Tran MRN: [id number] DOB/AGE: 01-25-1976 41 y.o.  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 Postoperatively he has had good relief of his right lumbar radicular pain. He's had moderate incisional pain and some nerve root irritation. He is up and ambulating actively in the halls. His dressing was removed, he has mild bruising adjacent to the incision area, but the incision itself is healing nicely. He is voiding well. He is scheduled for follow-up with me in the office in 3 weeks with x-rays. He is asking to be discharged to home. He has been given instructions regarding wound care and activities following discharge.  Discharge Exam: Blood pressure 127/71, pulse 94, temperature 99.5 F (37.5 C), temperature source Oral, resp. rate 20, weight 93.4 kg (206 lb), SpO2 99 %.  Disposition: 01-Home or Self Care  Discharge Instructions    Discharge wound care:    Complete by:  As directed    Leave the wound open to air. Shower daily with the wound uncovered. Water and soapy water should run over the incision area. Do not wash directly on the incision for 2 weeks. Remove the glue after 2 weeks.   Driving Restrictions    Complete by:  As directed     No driving for 2 weeks. May ride in the car locally now. May begin to drive locally in 2 weeks.   Other Restrictions    Complete by:  As directed    Walk gradually increasing distances out in the fresh air at least twice a day. Walking additional 6 times inside the house, gradually increasing distances, daily. No bending, lifting, or twisting. Perform activities between shoulder and waist height (that is at counter height when standing or table height when sitting).     Allergies as of 03/06/2017      Reactions   No Known Allergies       Medication List    TAKE these medications   acetaminophen 500 MG tablet Commonly known as:  TYLENOL Take 1,000 mg by mouth every 8 (eight) hours as needed for mild pain or headache.   cyclobenzaprine 10 MG tablet Commonly known as:  FLEXERIL Take 0.5-1 tablets (5-10 mg total) by mouth 3 (three) times daily as needed for muscle spasms.   HYDROcodone-acetaminophen 5-325 MG tablet Commonly known as:  NORCO/VICODIN Take 1-2 tablets by mouth every 4 (four) hours as needed (pain). What changed:  Another medication with the same name was added. Make sure you understand how and when to take each.   HYDROcodone-acetaminophen 7.5-325 MG tablet Commonly known as:  NORCO Take 1-2 tablets by mouth every 4 (four) hours as needed for severe pain (pain). What changed:  You were already taking a medication with the same name, and this prescription was added. Make sure you understand how and when to take each.   HYDROcodone-acetaminophen 5-325 MG tablet Commonly known as:  NORCO Take  1-2 tablets by mouth every 4 (four) hours as needed for moderate pain. What changed:  You were already taking a medication with the same name, and this prescription was added. Make sure you understand how and when to take each.   omeprazole 40 MG capsule Commonly known as:  PRILOSEC Take 40 mg by mouth daily as needed (for heartburn/acid reflux.).         SignedHosie Spangle 03/06/2017, 7:13 PM

## 2017-03-06 NOTE — Progress Notes (Signed)
Vitals:   03/05/17 2000 03/05/17 2356 03/06/17 0400 03/06/17 0724  BP: 140/88 106/62 112/72 109/78  Pulse: 81 78 76 79  Resp: 20 20 18 18   Temp: 98.4 F (36.9 C) 98.4 F (36.9 C) 98.7 F (37.1 C) 98.4 F (36.9 C)  TempSrc: Oral Oral Oral Oral  SpO2: 99% 97% 100% 99%  Weight:        Patient up and ambulating in the halls with his wife. Foley DC'd this morning, no void yet, nursing staff monitoring voiding function. Dressing dry and intact. Some incisional discomfort, but good relief of radicular pain.  Plan: Encouraged to ambulate. Continue to progress through postoperative recovery.  Hosie Spangle, MD 03/06/2017, 8:43 AM

## 2017-03-06 NOTE — Progress Notes (Signed)
Patient d/c'd to home accompanied by wife. Discharge instructions and prescrition  given  and verbalized understanding.

## 2017-03-08 ENCOUNTER — Emergency Department (HOSPITAL_COMMUNITY): Payer: 59

## 2017-03-08 ENCOUNTER — Observation Stay (HOSPITAL_COMMUNITY)
Admission: EM | Admit: 2017-03-08 | Discharge: 2017-03-10 | Disposition: A | Discharge status: Home / Self Care | Payer: 59 | Attending: Neurosurgery | Admitting: Neurosurgery

## 2017-03-08 DIAGNOSIS — Z87891 Personal history of nicotine dependence: Secondary | ICD-10-CM | POA: Insufficient documentation

## 2017-03-08 DIAGNOSIS — E860 Dehydration: Secondary | ICD-10-CM | POA: Diagnosis not present

## 2017-03-08 DIAGNOSIS — R519 Headache, unspecified: Secondary | ICD-10-CM

## 2017-03-08 DIAGNOSIS — K567 Ileus, unspecified: Secondary | ICD-10-CM | POA: Diagnosis not present

## 2017-03-08 DIAGNOSIS — G8918 Other acute postprocedural pain: Secondary | ICD-10-CM

## 2017-03-08 DIAGNOSIS — M48061 Spinal stenosis, lumbar region without neurogenic claudication: Secondary | ICD-10-CM | POA: Diagnosis not present

## 2017-03-08 DIAGNOSIS — K219 Gastro-esophageal reflux disease without esophagitis: Secondary | ICD-10-CM | POA: Diagnosis not present

## 2017-03-08 DIAGNOSIS — M5030 Other cervical disc degeneration, unspecified cervical region: Secondary | ICD-10-CM | POA: Insufficient documentation

## 2017-03-08 DIAGNOSIS — E876 Hypokalemia: Secondary | ICD-10-CM | POA: Diagnosis not present

## 2017-03-08 DIAGNOSIS — F419 Anxiety disorder, unspecified: Secondary | ICD-10-CM | POA: Insufficient documentation

## 2017-03-08 DIAGNOSIS — R51 Headache: Secondary | ICD-10-CM

## 2017-03-08 LAB — CBC WITH DIFFERENTIAL/PLATELET
BASOS ABS: 0 10*3/uL (ref 0.0–0.1)
BASOS PCT: 0 %
Eosinophils Absolute: 0 10*3/uL (ref 0.0–0.7)
Eosinophils Relative: 0 %
HEMATOCRIT: 38.4 % — AB (ref 39.0–52.0)
HEMOGLOBIN: 12.4 g/dL — AB (ref 13.0–17.0)
LYMPHS PCT: 9 %
Lymphs Abs: 1 10*3/uL (ref 0.7–4.0)
MCH: 32.1 pg (ref 26.0–34.0)
MCHC: 32.3 g/dL (ref 30.0–36.0)
MCV: 99.5 fL (ref 78.0–100.0)
Monocytes Absolute: 0.6 10*3/uL (ref 0.1–1.0)
Monocytes Relative: 5 %
NEUTROS ABS: 9.6 10*3/uL — AB (ref 1.7–7.7)
NEUTROS PCT: 86 %
Platelets: 249 10*3/uL (ref 150–400)
RBC: 3.86 MIL/uL — AB (ref 4.22–5.81)
RDW: 13.3 % (ref 11.5–15.5)
WBC: 11.2 10*3/uL — AB (ref 4.0–10.5)

## 2017-03-08 LAB — URINALYSIS, ROUTINE W REFLEX MICROSCOPIC
BILIRUBIN URINE: NEGATIVE
Bacteria, UA: NONE SEEN
Glucose, UA: NEGATIVE mg/dL
KETONES UR: 20 mg/dL — AB
Leukocytes, UA: NEGATIVE
Nitrite: NEGATIVE
Protein, ur: 100 mg/dL — AB
Specific Gravity, Urine: 1.028 (ref 1.005–1.030)
pH: 6 (ref 5.0–8.0)

## 2017-03-08 LAB — COMPREHENSIVE METABOLIC PANEL
ALBUMIN: 2.9 g/dL — AB (ref 3.5–5.0)
ALT: 113 U/L — AB (ref 17–63)
AST: 71 U/L — AB (ref 15–41)
Alkaline Phosphatase: 53 U/L (ref 38–126)
Anion gap: 8 (ref 5–15)
BUN: 12 mg/dL (ref 6–20)
CO2: 24 mmol/L (ref 22–32)
CREATININE: 0.76 mg/dL (ref 0.61–1.24)
Calcium: 8.3 mg/dL — ABNORMAL LOW (ref 8.9–10.3)
Chloride: 107 mmol/L (ref 101–111)
GFR calc Af Amer: >60 mL/min (ref >60)
GFR calc non Af Amer: >60 mL/min (ref >60)
GLUCOSE: 123 mg/dL — AB (ref 65–99)
POTASSIUM: 4.1 mmol/L (ref 3.5–5.1)
Sodium: 139 mmol/L (ref 135–145)
TOTAL PROTEIN: 6.2 g/dL — AB (ref 6.5–8.1)
Total Bilirubin: 1.2 mg/dL (ref 0.3–1.2)

## 2017-03-08 MED ORDER — CYCLOBENZAPRINE HCL 10 MG PO TABS: 5.0000 mg | ORAL_TABLET | Freq: Three times a day (TID) | ORAL | Status: DC | PRN

## 2017-03-08 MED ORDER — METOCLOPRAMIDE HCL 5 MG/ML IJ SOLN
10.0000 mg | Freq: Once | INTRAMUSCULAR | Status: AC
  Administered 2017-03-08: 10 mg via INTRAVENOUS
  Filled 2017-03-08: qty 2

## 2017-03-08 MED ORDER — CYCLOBENZAPRINE HCL 10 MG PO TABS
10.0000 mg | ORAL_TABLET | Freq: Three times a day (TID) | ORAL | Status: DC | PRN
  Administered 2017-03-09 (×2): 10 mg via ORAL
  Filled 2017-03-08 (×2): qty 1

## 2017-03-08 MED ORDER — MENTHOL 3 MG MT LOZG: 1.0000 | LOZENGE | OROMUCOSAL | Status: DC | PRN

## 2017-03-08 MED ORDER — PANTOPRAZOLE SODIUM 40 MG PO TBEC
80.0000 mg | DELAYED_RELEASE_TABLET | Freq: Every day | ORAL | Status: DC
  Administered 2017-03-09 – 2017-03-10 (×3): 80 mg via ORAL
  Filled 2017-03-08 (×3): qty 2

## 2017-03-08 MED ORDER — MORPHINE SULFATE (PF) 4 MG/ML IV SOLN
4.0000 mg | INTRAVENOUS | Status: DC | PRN
Start: 2017-03-08 — End: 2017-03-10
  Administered 2017-03-09 (×3): 4 mg via INTRAVENOUS
  Filled 2017-03-08 (×3): qty 1

## 2017-03-08 MED ORDER — ONDANSETRON HCL 4 MG/2ML IJ SOLN
4.0000 mg | Freq: Once | INTRAMUSCULAR | Status: AC
  Administered 2017-03-08: 4 mg via INTRAVENOUS
  Filled 2017-03-08: qty 2

## 2017-03-08 MED ORDER — ONDANSETRON HCL 4 MG/2ML IJ SOLN
4.0000 mg | Freq: Four times a day (QID) | INTRAMUSCULAR | Status: DC | PRN
  Administered 2017-03-09: 4 mg via INTRAVENOUS
  Filled 2017-03-08: qty 2

## 2017-03-08 MED ORDER — ACETAMINOPHEN 500 MG PO TABS: 1000.0000 mg | ORAL_TABLET | Freq: Three times a day (TID) | ORAL | Status: DC | PRN

## 2017-03-08 MED ORDER — PHENOL 1.4 % MT LIQD: 1.0000 | OROMUCOSAL | Status: DC | PRN

## 2017-03-08 MED ORDER — POTASSIUM CHLORIDE 2 MEQ/ML IV SOLN
INTRAVENOUS | Status: DC
  Administered 2017-03-09: via INTRAVENOUS
  Filled 2017-03-08 (×2): qty 1000

## 2017-03-08 MED ORDER — SODIUM CHLORIDE 0.9 % IV BOLUS (SEPSIS)
1000.0000 mL | Freq: Once | INTRAVENOUS | Status: AC
  Administered 2017-03-08: 1000 mL via INTRAVENOUS

## 2017-03-08 MED ORDER — BUTALBITAL-APAP-CAFFEINE 50-325-40 MG PO TABS
1.0000 | ORAL_TABLET | Freq: Once | ORAL | Status: AC
  Administered 2017-03-08: 1 via ORAL
  Filled 2017-03-08: qty 1

## 2017-03-08 MED ORDER — DOCUSATE SODIUM 100 MG PO CAPS
100.0000 mg | ORAL_CAPSULE | Freq: Two times a day (BID) | ORAL | Status: DC
  Administered 2017-03-09 – 2017-03-10 (×4): 100 mg via ORAL
  Filled 2017-03-08 (×4): qty 1

## 2017-03-08 MED ORDER — DIPHENHYDRAMINE HCL 50 MG/ML IJ SOLN
25.0000 mg | Freq: Once | INTRAMUSCULAR | Status: AC
  Administered 2017-03-08: 25 mg via INTRAVENOUS
  Filled 2017-03-08: qty 1

## 2017-03-08 MED ORDER — HYDROCODONE-ACETAMINOPHEN 5-325 MG PO TABS: 1.0000 | ORAL_TABLET | ORAL | Status: DC | PRN

## 2017-03-08 MED ORDER — HYDROMORPHONE HCL 1 MG/ML IJ SOLN
1.0000 mg | Freq: Once | INTRAMUSCULAR | Status: AC
  Administered 2017-03-08: 1 mg via INTRAVENOUS
  Filled 2017-03-08: qty 1

## 2017-03-08 MED ORDER — ONDANSETRON HCL 4 MG PO TABS: 4.0000 mg | ORAL_TABLET | Freq: Four times a day (QID) | ORAL | Status: DC | PRN

## 2017-03-08 MED ORDER — BISACODYL 10 MG RE SUPP: 10.0000 mg | Freq: Every day | RECTAL | Status: DC | PRN

## 2017-03-08 NOTE — H&P (Signed)
Subjective: The patient is a 41 year old white male patient of Dr. Donnella Bi. He performed a 2 level lumbar fusion on the patient 4 days ago. The patient initially did well and was discharged to home. The patient a subacute headaches throughout the day yesterday with some nausea and vomiting. His headache worsened today. He thought he might have sinusitis. He saw his primary doctor who advised him to come to the ER.  The patient was worked up with a head CT which was unremarkable. He has had a chest x-ray which was normal. He has a mild leukocytosis and abnormalities on his CMP.  Presently the patient is alert and pleasant. He does not appear ill. He says his headache is better. He has not had any nausea or vomiting since approximate 4 AM this morning, although prior to this he "wasn't able to hold anything down".. His headaches are not postural. He has noted minimal sporadic bloody drainage from his wound. He denies back pain. His radicular symptoms have improved with surgery. He has not noted any fevers. He has had some photophobia with his headaches. He denies passing flatus.  Past Medical History:  Diagnosis Date  . Anxiety   . DDD (degenerative disc disease), cervical   . GERD (gastroesophageal reflux disease)   . Neuromuscular disorder ([place])    right ulnar nerve pain  . Seasonal allergies     Past Surgical History:  Procedure Laterality Date  . ANTERIOR CERVICAL DECOMPRESSION/DISCECTOMY FUSION 4 LEVELS N/A 10/25/2016   Procedure: ANTERIOR CERVICAL DECOMPRESSION/DISCECTOMY FUSION  CERVICAL THREE-CERVICAL FOUR  CERVICAL FOUR-CERVICAL FIVE ,CERVICAL FIVE-CERVICAL SIX ,CERVICAL SIX-CERVICAL SEVEN;  Surgeon: Jovita Gamma, MD;  Location: [facility];  Service: Neurosurgery;  Laterality: N/A;  . BRONCHOSCOPY    . HERNIA REPAIR Right    inguinal hernia  . WISDOM TOOTH EXTRACTION      Allergies  Allergen Reactions  . No Known Allergies     Social History  Substance Use Topics  . Smoking  status: Former Smoker    Quit date: 10/24/1996  . Smokeless tobacco: Current User    Types: Snuff  . Alcohol use Yes     Comment: occassional    Family History  Problem Relation Age of Onset  . Kidney cancer Father   . COPD Father   . Hypertension Father    Prior to Admission medications   Medication Sig Start Date End Date Taking? Authorizing Provider  acetaminophen (TYLENOL) 500 MG tablet Take 1,000 mg by mouth every 8 (eight) hours as needed for mild pain or headache.     [provider]  cyclobenzaprine (FLEXERIL) 10 MG tablet Take 0.5-1 tablets (5-10 mg total) by mouth 3 (three) times daily as needed for muscle spasms. 03/06/17   Jovita Gamma, MD  HYDROcodone-acetaminophen (NORCO) 5-325 MG tablet Take 1-2 tablets by mouth every 4 (four) hours as needed for moderate pain. 03/06/17   Jovita Gamma, MD  HYDROcodone-acetaminophen (NORCO) 7.5-325 MG tablet Take 1-2 tablets by mouth every 4 (four) hours as needed for severe pain (pain). 03/06/17   Jovita Gamma, MD  HYDROcodone-acetaminophen (NORCO/VICODIN) 5-325 MG tablet Take 1-2 tablets by mouth every 4 (four) hours as needed (pain). Patient not taking: Reported on 02/21/2017 10/26/16   Jovita Gamma, MD  omeprazole (PRILOSEC) 40 MG capsule Take 40 mg by mouth daily as needed (for heartburn/acid reflux.).    [provider]     Review of Systems  Positive ROS: As above   All other systems have been reviewed and were  otherwise negative with the exception of those mentioned in the HPI and as above.  Objective: Vital signs in last 24 hours: Temp:  [98.5 F (36.9 C)] 98.5 F (36.9 C) (06/21 1707) Pulse Rate:  [87-93] 91 (06/21 1930) Resp:  [17-18] 17 (06/21 1930) BP: (113-150)/(74-90) 122/83 (06/21 1930) SpO2:  [97 %-100 %] 97 % (06/21 1930) Weight:  [93.4 kg (206 lb)] 93.4 kg (206 lb) (06/21 1707)  General Appearance:  The patient is alert and pleasant. He is in no apparent distress. He looks  well.  HEENT: unremarkable  Neck: The patient's left cervical incision is well-healed. There is no meningismus. He has a normal range of motion.  Thorax: Symmetric  Abdomen: Soft  Extremities: Unremarkable  Neurologic exam: The patient is alert and oriented 3. History is normal in his lower extremities.  Back exam: The patient's lumbar incision has some peri-incisional ecchymosis. There is no drainage. He has Dermabond in place  I have reviewed the patient's head CT performed today. It is unremarkable.  Data Review Lab Results  Component Value Date   WBC 11.2 (H) 03/08/2017   HGB 12.4 (L) 03/08/2017   HCT 38.4 (L) 03/08/2017   MCV 99.5 03/08/2017   PLT 249 03/08/2017   Lab Results  Component Value Date   NA 139 03/08/2017   K 4.1 03/08/2017   CL 107 03/08/2017   CO2 24 03/08/2017   BUN 12 03/08/2017   CREATININE 0.76 03/08/2017   GLUCOSE 123 (H) 03/08/2017   No results found for: INR, PROTIME  Assessment/Plan: Headache, nausea, vomiting, dehydration, leukocytosis: I have discussed the situation with the patient, his wife and mother. I don't think the patient has a spinal fluid leak because Dr. Sherwood Gambler is operative note doesn't mention spinal cord leakage and his headaches are not postural. There is no drainage from his wound.  Perhaps the patient developed a mild ileus and some dehydration. I recommended the patient be admitted for IV hydration and observation. They were agreeable. Dr. Sherwood Gambler will follow-up with him tomorrow.   Joclyn Alsobrook D 03/08/2017 9:12 PM

## 2017-03-08 NOTE — ED Provider Notes (Signed)
[facility] DEPT Provider Note   CSN: [id number] Arrival date & time: 03/08/17  1650     History   Chief Complaint Chief Complaint  Patient presents with  . Headache  . Post-op Problem    HPI Brian Tran is a 41 y.o. male.  The history is provided by the patient, medical records and the spouse. No language interpreter was used.  Headache   This is a new problem. The current episode started yesterday. The problem occurs constantly. The problem has been gradually improving. The headache is associated with emotional stress, loud noise, bright light and activity. The pain is located in the bilateral region. The quality of the pain is described as sharp. The pain is at a severity of 10/10. The pain is severe. The pain does not radiate. Associated symptoms include nausea and vomiting. Pertinent negatives include no fever, no palpitations and no shortness of breath. He has tried resting in a darkened room for the symptoms. The treatment provided no relief.    Past Medical History:  Diagnosis Date  . Anxiety   . DDD (degenerative disc disease), cervical   . GERD (gastroesophageal reflux disease)   . Neuromuscular disorder ([place])    right ulnar nerve pain  . Seasonal allergies     Patient Active Problem List   Diagnosis Date Noted  . Dehydration 03/08/2017  . Lumbar stenosis 03/05/2017  . HNP (herniated nucleus pulposus), cervical 10/25/2016    Past Surgical History:  Procedure Laterality Date  . ANTERIOR CERVICAL DECOMPRESSION/DISCECTOMY FUSION 4 LEVELS N/A 10/25/2016   Procedure: ANTERIOR CERVICAL DECOMPRESSION/DISCECTOMY FUSION  CERVICAL THREE-CERVICAL FOUR  CERVICAL FOUR-CERVICAL FIVE ,CERVICAL FIVE-CERVICAL SIX ,CERVICAL SIX-CERVICAL SEVEN;  Surgeon: Jovita Gamma, MD;  Location: [facility];  Service: Neurosurgery;  Laterality: N/A;  . BRONCHOSCOPY    . HERNIA REPAIR Right    inguinal hernia  . WISDOM TOOTH EXTRACTION         Home Medications    Prior to Admission  medications   Medication Sig Start Date End Date Taking? Authorizing Provider  acetaminophen (TYLENOL) 500 MG tablet Take 1,000 mg by mouth every 8 (eight) hours as needed for mild pain or headache.    Yes [provider]  HYDROcodone-acetaminophen (NORCO) 7.5-325 MG tablet Take 1-2 tablets by mouth every 4 (four) hours as needed for severe pain (pain). Patient taking differently: Take 0.5-1 tablets by mouth every 4 (four) hours as needed for severe pain (pain).  03/06/17  Yes Jovita Gamma, MD  omeprazole (PRILOSEC) 40 MG capsule Take 40 mg by mouth daily as needed (for heartburn/acid reflux.).   Yes [provider]  cyclobenzaprine (FLEXERIL) 10 MG tablet Take 0.5-1 tablets (5-10 mg total) by mouth 3 (three) times daily as needed for muscle spasms. Patient not taking: Reported on 03/08/2017 03/06/17   Jovita Gamma, MD    Family History Family History  Problem Relation Age of Onset  . Kidney cancer Father   . COPD Father   . Hypertension Father     Social History Social History  Substance Use Topics  . Smoking status: Former Smoker    Quit date: 10/24/1996  . Smokeless tobacco: Current User    Types: Snuff  . Alcohol use Yes     Comment: occassional     Allergies   Tramadol   Review of Systems Review of Systems  Constitutional: Negative for chills and fever.  HENT: Negative for ear pain and sore throat.   Eyes: Negative for pain and visual disturbance.  Respiratory: Negative for cough and shortness of breath.   Cardiovascular: Negative for chest pain and palpitations.  Gastrointestinal: Positive for nausea and vomiting. Negative for abdominal pain.  Genitourinary: Negative for dysuria and hematuria.  Musculoskeletal: Negative for arthralgias and back pain.  Skin: Positive for wound. Negative for color change and rash.  Neurological: Positive for headaches. Negative for seizures and syncope.  All other systems reviewed and are  negative.    Physical Exam Updated Vital Signs BP (!) 149/93 (BP Location: Right Arm)   Pulse 72   Temp 98.4 F (36.9 C) (Oral)   Resp 16   Ht 6\' 3"  (1.905 m)   Wt 93.7 kg (206 lb 9.6 oz)   SpO2 100%   BMI 25.82 kg/m   Physical Exam  Constitutional: He appears well-developed. He appears ill.  HENT:  Head: Normocephalic and atraumatic.  Eyes: Conjunctivae are normal.  Neck: Neck supple.  Cardiovascular: Normal rate and regular rhythm.   No murmur heard. Pulmonary/Chest: Effort normal and breath sounds normal. No respiratory distress.  Abdominal: Soft. There is no tenderness.  Musculoskeletal: He exhibits no edema.  Lower back with post-surgical scar. Small bruise aroudn site. No active drainage  Neurological: He is alert. No cranial nerve deficit. Coordination normal.  5/5 motor strength and intact sensation in all extremities. Intact bilateral finger-to-nose coordination  Skin: Skin is warm and dry.  Nursing note and vitals reviewed.    ED Treatments / Results  Labs (all labs ordered are listed, but only abnormal results are displayed) Labs Reviewed  CBC WITH DIFFERENTIAL/PLATELET - Abnormal; Notable for the following:       Result Value   WBC 11.2 (*)    RBC 3.86 (*)    Hemoglobin 12.4 (*)    HCT 38.4 (*)    Neutro Abs 9.6 (*)    All other components within normal limits  COMPREHENSIVE METABOLIC PANEL - Abnormal; Notable for the following:    Glucose, Bld 123 (*)    Calcium 8.3 (*)    Total Protein 6.2 (*)    Albumin 2.9 (*)    AST 71 (*)    ALT 113 (*)    All other components within normal limits  URINALYSIS, ROUTINE W REFLEX MICROSCOPIC - Abnormal; Notable for the following:    Color, Urine AMBER (*)    APPearance HAZY (*)    Hgb urine dipstick SMALL (*)    Ketones, ur 20 (*)    Protein, ur 100 (*)    Squamous Epithelial / LPF 0-5 (*)    All other components within normal limits  CBC  BASIC METABOLIC PANEL    EKG  EKG Interpretation None        Radiology Dg Chest 2 View  Result Date: 03/08/2017 CLINICAL DATA:  Headache, cough, fever, lumbar fusion 03/05/2017 EXAM: CHEST  2 VIEW COMPARISON:  03/08/2005 FINDINGS: Normal heart size, mediastinal contours, and pulmonary vascularity. Lungs clear. No pleural effusion or pneumothorax. Prior cervical spine fusion. IMPRESSION: No acute abnormalities. Electronically Signed   By: Lavonia Dana M.D.   On: 03/08/2017 20:29   Ct Head Wo Contrast  Result Date: 03/08/2017 CLINICAL DATA:  Severe headache and photophobia March 07, 2017, status post lumbar spine fusion June 19th. EXAM: CT HEAD WITHOUT CONTRAST TECHNIQUE: Contiguous axial images were obtained from the base of the skull through the vertex without intravenous contrast. COMPARISON:  CT HEAD June 13, 2008 FINDINGS: BRAIN: No intraparenchymal hemorrhage, mass effect nor midline shift. The ventricles and sulci  are normal. No acute large vascular territory infarcts. No abnormal extra-axial fluid collections. Basal cisterns are patent. VASCULAR: Unremarkable. SKULL/SOFT TISSUES: No skull fracture. No significant soft tissue swelling. ORBITS/SINUSES: The included ocular globes and orbital contents are normal.Trace paranasal sinus mucosal thickening. Mastoid air cells are well aerated. Pneumatized LEFT petrous apex. OTHER: None. IMPRESSION: Normal noncontrast CT HEAD. Electronically Signed   By: Elon Alas M.D.   On: 03/08/2017 18:32    Procedures Procedures (including critical care time)  Medications Ordered in ED Medications  acetaminophen (TYLENOL) tablet 1,000 mg (not administered)  pantoprazole (PROTONIX) EC tablet 80 mg (80 mg Oral Given 03/09/17 0016)  cyclobenzaprine (FLEXERIL) tablet 10 mg (10 mg Oral Given 03/09/17 0017)  docusate sodium (COLACE) capsule 100 mg (100 mg Oral Given 03/09/17 0016)  bisacodyl (DULCOLAX) suppository 10 mg (not administered)  ondansetron (ZOFRAN) tablet 4 mg (not administered)    Or  ondansetron  (ZOFRAN) injection 4 mg (not administered)  menthol-cetylpyridinium (CEPACOL) lozenge 3 mg (not administered)    Or  phenol (CHLORASEPTIC) mouth spray 1 spray (not administered)  lactated ringers 1,000 mL with potassium chloride 20 mEq infusion ( Intravenous New Bag/Given 03/09/17 0017)  morphine 4 MG/ML injection 4 mg (4 mg Intravenous Given 03/09/17 0028)  senna (SENOKOT) tablet 8.6 mg (not administered)  polyethylene glycol (MIRALAX / GLYCOLAX) packet 17 g (not administered)  HYDROmorphone (DILAUDID) injection 1 mg (1 mg Intravenous Given 03/08/17 1835)  sodium chloride 0.9 % bolus 1,000 mL (0 mLs Intravenous Stopped 03/08/17 1915)  ondansetron (ZOFRAN) injection 4 mg (4 mg Intravenous Given 03/08/17 1836)  diphenhydrAMINE (BENADRYL) injection 25 mg (25 mg Intravenous Given 03/08/17 1840)  metoCLOPramide (REGLAN) injection 10 mg (10 mg Intravenous Given 03/08/17 1838)  sodium chloride 0.9 % bolus 1,000 mL (0 mLs Intravenous Stopped 03/08/17 2116)  butalbital-acetaminophen-caffeine (FIORICET, ESGIC) 50-325-40 MG per tablet 1 tablet (1 tablet Oral Given 03/08/17 1956)  HYDROmorphone (DILAUDID) injection 1 mg (1 mg Intravenous Given 03/08/17 2147)     Initial Impression / Assessment and Plan / ED Course  I have reviewed the triage vital signs and the nursing notes.  Pertinent labs & imaging results that were available during my care of the patient were reviewed by me and considered in my medical decision making (see chart for details).     41 year old male history of cervical DDD s/p ACDF, lumbar stenosis currently POD#3 from lumbar decompression and PLIF by Dr. Sherwood [facility] of [place] who presents as a transfer from PCP for severe HA. Pt reports adequate pain control upon discharge home 2 days ago after receiving morphine injection. Yesterday morning awoke acutely with severe HA 10/10 with associated photophobia and nausea. Attempted to push through pain until PCP visit this AM. Had NBNB vomiting episode at  0400 today. Reports HA improved to 4/10 after 3x doses of fentayl while en route by EMS. Denies recent trauma. Reports warmth, chills and diaphoresis. No documented fevers or neck stiffness.  AF, VSS. Pt appears uncomfortable on exam. No focal neuro deficits. Lumbar region with bruising and no active drainage from surgical site.  Spoke with Neurosurgery, who evaluated pt at bedside. Pt admitted for further monitoring and evaluation. Pt stable at time of transfer.  Pt care d/w Dr. Darl Householder  Final Clinical Impressions(s) / ED Diagnoses   Final diagnoses:  Post-op pain  Acute nonintractable headache, unspecified headache type    New Prescriptions Current Discharge Medication List       Payton Emerald, MD 03/09/17 [place],  Lujean Rave, MD 03/09/17 [phone]

## 2017-03-08 NOTE — ED Notes (Signed)
Patient transported to CT 

## 2017-03-08 NOTE — ED Triage Notes (Signed)
Pt arrives by [facility] from Md office after being seen for 10/10 headache, pt had lumbar fusion on 6/18 by MD nudelman. Pt was given 60mg  Toradol in office and 191mcg of fentanyl IV en route to ED. Pt arrives diaphoretic now reports HA 2/10. Pt states he took Tylenol prior to going to MD office today. Pt is sensitive to light

## 2017-03-08 NOTE — Progress Notes (Signed)
Patient arrived to unit via ED stretcher with family and belongings at bedside.  Vitals stable. Oriented to room/unit. Admission completed. Continue to monitor.

## 2017-03-09 ENCOUNTER — Encounter (HOSPITAL_COMMUNITY): Payer: Self-pay | Admitting: *Deleted

## 2017-03-09 LAB — CBC
HEMATOCRIT: 35.6 % — AB (ref 39.0–52.0)
HEMOGLOBIN: 11.6 g/dL — AB (ref 13.0–17.0)
MCH: 32 pg (ref 26.0–34.0)
MCHC: 32.6 g/dL (ref 30.0–36.0)
MCV: 98.3 fL (ref 78.0–100.0)
Platelets: 247 10*3/uL (ref 150–400)
RBC: 3.62 MIL/uL — ABNORMAL LOW (ref 4.22–5.81)
RDW: 13.2 % (ref 11.5–15.5)
WBC: 8.4 10*3/uL (ref 4.0–10.5)

## 2017-03-09 LAB — BASIC METABOLIC PANEL
ANION GAP: 6 (ref 5–15)
BUN: 14 mg/dL (ref 6–20)
CHLORIDE: 105 mmol/L (ref 101–111)
CO2: 28 mmol/L (ref 22–32)
Calcium: 8 mg/dL — ABNORMAL LOW (ref 8.9–10.3)
Creatinine, Ser: 0.75 mg/dL (ref 0.61–1.24)
GFR calc non Af Amer: >60 mL/min (ref >60)
GLUCOSE: 117 mg/dL — AB (ref 65–99)
POTASSIUM: 3.1 mmol/L — AB (ref 3.5–5.1)
Sodium: 139 mmol/L (ref 135–145)

## 2017-03-09 MED ORDER — KCL IN DEXTROSE-NACL 20-5-0.45 MEQ/L-%-% IV SOLN: INTRAVENOUS | Status: DC

## 2017-03-09 MED ORDER — SENNA 8.6 MG PO TABS
1.0000 | ORAL_TABLET | Freq: Two times a day (BID) | ORAL | Status: DC
  Administered 2017-03-09 – 2017-03-10 (×3): 8.6 mg via ORAL
  Filled 2017-03-09 (×3): qty 1

## 2017-03-09 MED ORDER — HYDROCODONE-ACETAMINOPHEN 5-325 MG PO TABS
1.0000 | ORAL_TABLET | ORAL | Status: DC | PRN
  Administered 2017-03-09 (×3): 2 via ORAL
  Administered 2017-03-09: 1 via ORAL
  Administered 2017-03-10: 2 via ORAL
  Filled 2017-03-09: qty 2
  Filled 2017-03-09 (×3): qty 1
  Filled 2017-03-09 (×2): qty 2

## 2017-03-09 MED ORDER — HYDROXYZINE HCL 25 MG PO TABS: 50.0000 mg | ORAL_TABLET | ORAL | Status: DC | PRN

## 2017-03-09 MED ORDER — KCL IN DEXTROSE-NACL 40-5-0.45 MEQ/L-%-% IV SOLN
INTRAVENOUS | Status: DC
  Administered 2017-03-09 – 2017-03-10 (×2): via INTRAVENOUS
  Filled 2017-03-09 (×5): qty 1000

## 2017-03-09 MED ORDER — IBUPROFEN 200 MG PO TABS
600.0000 mg | ORAL_TABLET | Freq: Three times a day (TID) | ORAL | Status: DC
  Administered 2017-03-09 (×4): 600 mg via ORAL
  Filled 2017-03-09 (×5): qty 3

## 2017-03-09 MED ORDER — POLYETHYLENE GLYCOL 3350 17 G PO PACK: 17.0000 g | PACK | Freq: Every day | ORAL | Status: DC | PRN

## 2017-03-09 MED ORDER — HYDROXYZINE HCL 50 MG/ML IM SOLN: 50.0000 mg | INTRAMUSCULAR | Status: DC | PRN

## 2017-03-09 NOTE — Progress Notes (Signed)
OOB AMBULATING IN HALL WITH WIFE AT SIDE, NO DIFFICULTY REPORTED, NO HEADACHE NOTED. ~150 FEET.

## 2017-03-09 NOTE — Progress Notes (Signed)
Subjective: Patient admitted last night by Dr. Earle Gell after presenting to the emergency room with complaints of headaches, nausea, or vomiting. His by mouth intake has been poor, and his urine was concentrated. He was started on IV fluids. He explains that his headache is worse when he is laying flat. Morphine IV is made him feel worse, asking to use hydrocodone instead. CT brain last night unremarkable.  Objective: Vital signs in last 24 hours: Vitals:   03/08/17 2130 03/08/17 2200 03/08/17 2336 03/09/17 0612  BP: (!) 139/95 123/79 (!) 149/93 (!) 138/93  Pulse: 87 80 72 88  Resp: (!) 26 18 16 18   Temp:   98.4 F (36.9 C) 98.6 F (37 C)  TempSrc:   Oral Oral  SpO2: 100% 97% 100% 98%  Weight:   93.7 kg (206 lb 9.6 oz)   Height:   6\' 3"  (1.905 m)     Intake/Output from previous day: 06/21 0701 - 06/22 0700 In: 2278.8 [I.V.:2278.8] Out: -  Intake/Output this shift: No intake/output data recorded.  Physical Exam:  Awake and alert. Oriented. Following commands. Moving all 4 extremities well. Moderate induration underneath incision, with overlying ecchymosis. However no erythema or drainage.  CBC  Recent Labs  03/08/17 1907 03/09/17 0624  WBC 11.2* 8.4  HGB 12.4* 11.6*  HCT 38.4* 35.6*  PLT 249 247   BMET  Recent Labs  03/08/17 1907 03/09/17 0624  NA 139 139  K 4.1 3.1*  CL 107 105  CO2 24 28  GLUCOSE 123* 117*  BUN 12 14  CREATININE 0.76 0.75  CALCIUM 8.3* 8.0*    Studies/Results: Dg Chest 2 View  Result Date: 03/08/2017 CLINICAL DATA:  Headache, cough, fever, lumbar fusion 03/05/2017 EXAM: CHEST  2 VIEW COMPARISON:  03/08/2005 FINDINGS: Normal heart size, mediastinal contours, and pulmonary vascularity. Lungs clear. No pleural effusion or pneumothorax. Prior cervical spine fusion. IMPRESSION: No acute abnormalities. Electronically Signed   By: Lavonia Dana M.D.   On: 03/08/2017 20:29   Ct Head Wo Contrast  Result Date: 03/08/2017 CLINICAL DATA:  Severe  headache and photophobia March 07, 2017, status post lumbar spine fusion June 19th. EXAM: CT HEAD WITHOUT CONTRAST TECHNIQUE: Contiguous axial images were obtained from the base of the skull through the vertex without intravenous contrast. COMPARISON:  CT HEAD June 13, 2008 FINDINGS: BRAIN: No intraparenchymal hemorrhage, mass effect nor midline shift. The ventricles and sulci are normal. No acute large vascular territory infarcts. No abnormal extra-axial fluid collections. Basal cisterns are patent. VASCULAR: Unremarkable. SKULL/SOFT TISSUES: No skull fracture. No significant soft tissue swelling. ORBITS/SINUSES: The included ocular globes and orbital contents are normal.Trace paranasal sinus mucosal thickening. Mastoid air cells are well aerated. Pneumatized LEFT petrous apex. OTHER: None. IMPRESSION: Normal noncontrast CT HEAD. Electronically Signed   By: Elon Alas M.D.   On: 03/08/2017 18:32    Assessment/Plan: Continued headache, now 4 days postop. Admitted for rehydration. By mouth intake remains limited. Continues on IV fluids. Mild hypokalemia, IVF change to D5 half-normal saline with 40 mEq KCl per liter, rate increased 125 mL per hour. We will add Norco 5/325 one to 2 every 4 hours when necessary pain, and ibuprofen 600 mg 4 times a day.  Continuing supportive care. We'll recheck CBC and BMET in a.m.  Hosie Spangle, MD 03/09/2017, 7:47 AM

## 2017-03-09 NOTE — Progress Notes (Signed)
OOB WALKING IN HALL INDEPENDENTLY, NO DIFFICULTY. !150 FT.

## 2017-03-09 NOTE — Progress Notes (Signed)
OOB IN HALL WITH WIFE AT SIDE. AMBULATING ~100 FEET WITH NO DIFFICULTY, HA IMPROVED.

## 2017-03-09 NOTE — Progress Notes (Signed)
Patient continued complaints of head pain 8-10/10 throughout night, throbbing.  Inquiring about oral pain medication, call made to on call provider without return call [phone].  Minor complaints of nausea.  Patient voiced concerns about ongoing problem, looking forward to discussing with MD this AM to find answers.  Continue to monitor.

## 2017-03-10 LAB — BASIC METABOLIC PANEL
Anion gap: 5 (ref 5–15)
BUN: 12 mg/dL (ref 6–20)
CO2: 26 mmol/L (ref 22–32)
Calcium: 8.1 mg/dL — ABNORMAL LOW (ref 8.9–10.3)
Chloride: 107 mmol/L (ref 101–111)
Creatinine, Ser: 0.73 mg/dL (ref 0.61–1.24)
GFR calc Af Amer: >60 mL/min (ref >60)
GFR calc non Af Amer: >60 mL/min (ref >60)
Glucose, Bld: 106 mg/dL — ABNORMAL HIGH (ref 65–99)
Potassium: 3.6 mmol/L (ref 3.5–5.1)
Sodium: 138 mmol/L (ref 135–145)

## 2017-03-10 LAB — CBC WITH DIFFERENTIAL/PLATELET
Basophils Absolute: 0 10*3/uL (ref 0.0–0.1)
Basophils Relative: 0 %
Eosinophils Absolute: 0.1 10*3/uL (ref 0.0–0.7)
Eosinophils Relative: 3 %
HCT: 33.6 % — ABNORMAL LOW (ref 39.0–52.0)
Hemoglobin: 10.9 g/dL — ABNORMAL LOW (ref 13.0–17.0)
Lymphocytes Relative: 32 %
Lymphs Abs: 1.5 10*3/uL (ref 0.7–4.0)
MCH: 32 pg (ref 26.0–34.0)
MCHC: 32.4 g/dL (ref 30.0–36.0)
MCV: 98.5 fL (ref 78.0–100.0)
Monocytes Absolute: 0.6 10*3/uL (ref 0.1–1.0)
Monocytes Relative: 13 %
Neutro Abs: 2.4 10*3/uL (ref 1.7–7.7)
Neutrophils Relative %: 52 %
Platelets: 274 10*3/uL (ref 150–400)
RBC: 3.41 MIL/uL — ABNORMAL LOW (ref 4.22–5.81)
RDW: 13.5 % (ref 11.5–15.5)
WBC: 4.7 10*3/uL (ref 4.0–10.5)

## 2017-03-10 NOTE — Progress Notes (Signed)
Patient provided with discharge paperwork. Patient verbalized understanding of discharge instructions. Patient's IV removed. Patient's wife and children at the bedside. Patient taken down in a wheelchair to front lobby.

## 2017-03-10 NOTE — Discharge Summary (Signed)
Physician Discharge Summary  Patient ID: Brian Tran MRN: [id number] DOB/AGE: Jun 17, 1976 41 y.o.  Admit date: 03/08/2017 Discharge date: 03/10/2017  Admission Diagnoses:Dehydration overmedication  Discharge Diagnoses: Same Active Problems:   Dehydration   Discharged Condition: good  Hospital Course: Patient was admitted from the emergency room with headaches nausea vomiting dehydration. 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  Consults: Significant Diagnostic Studies: Treatments: Rehydration Discharge Exam: Blood pressure 111/83, pulse 64, temperature 98 F (36.7 C), temperature source Oral, resp. rate 20, height 6\' 3"  (1.905 m), weight 93.7 kg (206 lb 9.6 oz), SpO2 99 %. Awake alert strength out of 5 wound clean dry and intact  Disposition: Home   Allergies as of 03/10/2017      Reactions   Tramadol Nausea And Vomiting      Medication List    TAKE these medications   acetaminophen 500 MG tablet Commonly known as:  TYLENOL Take 1,000 mg by mouth every 8 (eight) hours as needed for mild pain or headache.   cyclobenzaprine 10 MG tablet Commonly known as:  FLEXERIL Take 0.5-1 tablets (5-10 mg total) by mouth 3 (three) times daily as needed for muscle spasms.   HYDROcodone-acetaminophen 7.5-325 MG tablet Commonly known as:  NORCO Take 1-2 tablets by mouth every 4 (four) hours as needed for severe pain (pain). What changed:  how much to take   omeprazole 40 MG capsule Commonly known as:  PRILOSEC Take 40 mg by mouth daily as needed (for heartburn/acid reflux.).      Follow-up Information    Jovita Gamma, MD Follow up.   Specialty:  Neurosurgery Contact  information: [address] [place] [address] [phone]           Signed: Elaina Hoops 03/10/2017, 8:43 AM

## 2017-03-10 NOTE — Progress Notes (Signed)
Patient ID: Brian Tran, male   DOB: 1975/11/26, 41 y.o.   MRN: [id number] Doing well no headache no nausea no back or leg pain.  Discharge home

## 2017-10-03 ENCOUNTER — Ambulatory Visit: Payer: 59 | Attending: Neurosurgery

## 2017-10-03 ENCOUNTER — Other Ambulatory Visit: Payer: Self-pay

## 2017-10-03 DIAGNOSIS — G8929 Other chronic pain: Secondary | ICD-10-CM | POA: Insufficient documentation

## 2017-10-03 DIAGNOSIS — M62830 Muscle spasm of back: Secondary | ICD-10-CM | POA: Diagnosis present

## 2017-10-03 DIAGNOSIS — M6281 Muscle weakness (generalized): Secondary | ICD-10-CM | POA: Diagnosis present

## 2017-10-03 DIAGNOSIS — M5441 Lumbago with sciatica, right side: Secondary | ICD-10-CM | POA: Diagnosis present

## 2017-10-03 NOTE — Therapy (Signed)
[facility] [address] [place], [place], [address] Phone: [phone]   Fax:  [phone]  Physical Therapy Treatment  Patient Details  Name: Brian Tran MRN: [id number] Date of Birth: [birth year] Referring Provider: Jovita Gamma, MD   Encounter Date: 10/03/2017  PT End of Session - 10/03/17 0942    Visit Number  1    Date for PT Re-Evaluation  11/28/17    Authorization Type  [employer]- 60 visit limit    PT Start Time  0846    PT Stop Time  0947    PT Time Calculation (min)  61 min    Activity Tolerance  Patient tolerated treatment well    Behavior During Therapy  [facility] for tasks assessed/performed       Past Medical History:  Diagnosis Date  . Anxiety   . DDD (degenerative disc disease), cervical   . GERD (gastroesophageal reflux disease)   . Neuromuscular disorder ([place])    right ulnar nerve pain  . Seasonal allergies     Past Surgical History:  Procedure Laterality Date  . ANTERIOR CERVICAL DECOMPRESSION/DISCECTOMY FUSION 4 LEVELS N/A 10/25/2016   Procedure: ANTERIOR CERVICAL DECOMPRESSION/DISCECTOMY FUSION  CERVICAL THREE-CERVICAL FOUR  CERVICAL FOUR-CERVICAL FIVE ,CERVICAL FIVE-CERVICAL SIX ,CERVICAL SIX-CERVICAL SEVEN;  Surgeon: Jovita Gamma, MD;  Location: [place];  Service: Neurosurgery;  Laterality: N/A;  . BRONCHOSCOPY    . HERNIA REPAIR Right    inguinal hernia  . WISDOM TOOTH EXTRACTION      There were no vitals filed for this visit.  Subjective Assessment - 10/03/17 0826    Subjective  Pt presents to PT 7 months s/p L3-4, L4-5 lumbar decompression and fusion.  Pt also had cervical fusion C3-6 in February 2018.  Pt had injury in September 2018.  Pt had Rt lumbar radiculopathy prior to surgery.  This is now resovled partially resolved and pt has resultant Rt sided lumbar pain.      Pertinent History  C3-6 cervical fusion 10/2016, L3-4, L4-5 lumbar decompression and fusion- 03/05/17    Limitations   Sitting    How long can you sit comfortably?  hard chair: 15-30 minutes    How long can you stand comfortably?  not significantly limited    How long can you walk comfortably?  up to 45 minutes-1 hour    Diagnostic tests  x-ray at MD: hardware is intact    Patient Stated Goals  not take medicine, reduce LBP    Currently in Pain?  Yes    Pain Score  8     Pain Location  Back    Pain Orientation  Right;Lower    Pain Descriptors / Indicators  Dull;Aching    Pain Type  Chronic pain    Pain Radiating Towards  Rt LE at times with standing or sitting too long    Pain Onset  More than a month ago    Pain Frequency  Constant    Aggravating Factors   sitting too long, standing too long, lifting heavy objects    Pain Relieving Factors  change of position, heating pad    Effect of Pain on Daily Activities  not able to lift heavy objects, sit or stand too long         [facility] PT Assessment - 10/03/17 0001      Assessment   Medical Diagnosis  chronic Rt sided LBP s/p lumbar fusion    Referring Provider  Jovita Gamma, MD  Onset Date/Surgical Date  03/05/17    Next MD Visit  6 months    Prior Therapy  prior to surgery      Precautions   Precautions  Back    Precaution Booklet Issued  No    Precaution Comments  lumbar fusion L3-5       Restrictions   Weight Bearing Restrictions  No      Balance Screen   Has the patient fallen in the past 6 months  No    Has the patient had a decrease in activity level because of a fear of falling?   No    Is the patient reluctant to leave their home because of a fear of falling?   No      Home Environment   Living Environment  Private residence    Type of [place] to enter    Home Layout  Two level    [place]  None      Prior Function   Level of [place]  Retired early retirement    Biomedical scientist  none    Leisure  wallking      Cognition   Overall Cognitive Status   Within Functional Limits for tasks assessed      Observation/Other Assessments   Focus on Therapeutic Outcomes (FOTO)   53% limitation      Posture/Postural Control   Posture/Postural Control  Postural limitations    Postural Limitations  Decreased lumbar lordosis;Decreased thoracic kyphosis      ROM / Strength   AROM / PROM / Strength  AROM;PROM;Strength      AROM   Overall AROM   Deficits    Overall AROM Comments  lumbar A/ROM limited by 75% into flexion and extension and 50% in bil sidebending.  Reduced thoracic and lumbar segmental mobility with lumbar and thoracic A/ROM      PROM   Overall PROM   Deficits    Overall PROM Comments  hip flexibility on the Rt limited by 50% with Rt lumbar pain reported with passive Rt hip movement.  Lt hip flexibility limited by 25% without pain.      Strength   Overall Strength  Deficits    Overall Strength Comments  Lt hip 4+/5, Lt knee 5/5, core strength is fair    Strength Assessment Site  Hip;Knee;Ankle    Right/Left Hip  Right    Right Hip Flexion  3-/5    Right Hip Extension  3+/5    Right Hip ABduction  3+/5    Right/Left Knee  Right    Right Knee Flexion  4/5    Right Knee Extension  4-/5    Right/Left Ankle  Right    Right Ankle Dorsiflexion  4+/5      Palpation   Spinal mobility  reduced lumbar mobility due to lumbar spinal fusion.  Thoracic spinal mobility limited by 25% without pain.      Palpation comment  well healed surgical incsion over the lumbar vertebrae.  palpable tenderness and trigger points over Rt lumbar paraspinals, quadratus and proximal gluteals.        Transfers   Transfers  Sit to Stand;Stand to Sit    Sit to Stand  6: Modified independent (Device/Increase time);With upper extremity assist    Stand to Sit  With upper extremity assist      Ambulation/Gait   Ambulation/Gait  Yes  Ambulation/Gait Assistance  6: Modified independent (Device/Increase time)    Gait Pattern  Decreased stance time - right     Gait Comments  decreased trunk rotation                  [facility] Adult PT Treatment/Exercise - 10/03/17 0001      Modalities   Modalities  Electrical Stimulation;Moist Heat      Moist Heat Therapy   Number Minutes Moist Heat  15 Minutes    Moist Heat Location  Lumbar Spine      Electrical Stimulation   Electrical Stimulation Location  Rt low back    Electrical Stimulation Action  IFC    Electrical Stimulation Parameters  15 minutes    Electrical Stimulation Goals  Pain             PT Education - 10/03/17 0926    Education provided  Yes    Education Details  seated LE strength, knee to chest, sidelying clam    Person(s) Educated  Patient    Methods  Explanation;Demonstration;Handout    Comprehension  Verbalized understanding;Returned demonstration       PT Short Term Goals - 10/03/17 0953      PT SHORT TERM GOAL #1   Title  be independent in initial HEP    Time  4    Period  Weeks    Status  New    Target Date  10/31/17      PT SHORT TERM GOAL #2   Title  report < or = to 5/10 lumbar pain with sitting > 30 minutes    Time  4    Period  Weeks    Status  New    Target Date  10/31/17      PT SHORT TERM GOAL #3   Title  verbalize and demonstrate correct body mechanics modificaions for lumbar protection with lifting and household tasks    Time  4    Period  Weeks    Status  New    Target Date  10/31/17      PT SHORT TERM GOAL #4   Title  demonstrate > or = to 4/5 Lt hip flexor strength to improve transitional motions     Time  4    Period  Weeks    Status  New    Target Date  10/31/17        PT Long Term Goals - 10/03/17 0955      PT LONG TERM GOAL #1   Title  be independent in advanced HEP    Time  8    Period  Weeks    Status  New    Target Date  11/28/17      PT LONG TERM GOAL #2   Title  reduce FOTO to < or = to 38% limitation    Time  8    Period  Weeks    Status  New    Target Date  11/28/17      PT LONG TERM GOAL #3    Title  sit for 15-20 minutes and report < or = to 3/10 LBP    Time  8    Period  Weeks    Status  New    Target Date  11/28/17      PT LONG TERM GOAL #4   Title  demonstrate 4/5 to 4+/5 Rt hip and knee strength to improve stability and endurance    Time  8    Period  Weeks    Status  New    Target Date  11/28/17      PT LONG TERM GOAL #5   Title  report a 50% reduction in Rt sided LBP with self-care and home tasks    Time  8    Period  Weeks    Status  New    Target Date  11/28/17            Plan - 10/03/17 0944    Clinical Impression Statement  Pt presents to PT 7 months s/p L3-5 lumbar decompression and fusion.  Pt had Rt sided radiculopathy prior to surgery and this has reduced athough is still present with sitting or standing too long.  Pt has resultant Rt sided lumbar pain that is constant and pt rates as 6-7/10.  Pt is limited to sitting >15-30 minutes or standing/walking > 1 hour due to pain and Rt LE symptoms.  Pt demonstrates stiffness and limited A/ROM in the lumbar and thoracic spine and Rt>Lt hips.  Pt with Rt LE hip and knee weakness and has palpable pain and trigger points in the Rt lumbar spine and proximal gluteals.  Pt is not able to lift heavy items from the floor.  Pt will benefit from skilled PT for core strength, Rt>Lt LE strength, flexibility,  functional mobility training, and modalities as needed.      History and Personal Factors relevant to plan of care:  cervical fusion 09/2016, lumbar fusion 02/2017    Clinical Presentation  Evolving    Clinical Presentation due to:  increasing Rt lumbar pain since surgery, sitting now more limited, continued Rt LE radiculopathy    Clinical Decision Making  Moderate    Rehab Potential  Good    PT Frequency  2x / week    PT Duration  8 weeks    PT Treatment/Interventions  ADLs/Self Care Home Management;Cryotherapy;Electrical Stimulation;Ultrasound;Moist Heat;Gait training;Functional mobility training;Therapeutic  activities;Therapeutic exercise;Patient/family education;Neuromuscular re-education;Manual techniques;Scar mobilization;Passive range of motion;Taping;Dry needling    PT Next Visit Plan  dry needling to Rt lumbar paraspinals, quadratus, proximal gluteals, body mechanics education, core strength, gentle flexibility, Rt LE strength    Consulted and Agree with Plan of Care  Patient       Patient will benefit from skilled therapeutic intervention in order to improve the following deficits and impairments:  Pain, Improper body mechanics, Decreased mobility, Decreased scar mobility, Increased muscle spasms  Visit Diagnosis: Muscle weakness (generalized) - Plan: PT plan of care cert/re-cert  Chronic right-sided low back pain with right-sided sciatica - Plan: PT plan of care cert/re-cert  Muscle spasm of back - Plan: PT plan of care cert/re-cert     Problem List Patient Active Problem List   Diagnosis Date Noted  . Dehydration 03/08/2017  . Lumbar stenosis 03/05/2017  . HNP (herniated nucleus pulposus), cervical 10/25/2016    Sigurd Sos, PT 10/03/17 10:02 AM  [facility] [address] [place], [place], [address] Phone: [phone]   Fax:  [phone]  Name: DEAVON PODGORSKI MRN: [id number] Date of Birth: 02/11/1976

## 2017-10-03 NOTE — Patient Instructions (Addendum)
Knee to Chest (Flexion)    Pull knee toward chest. Feel stretch in lower back or buttock area. Breathing deeply, Hold _20___ seconds. Repeat with other knee. Repeat _3___ times. Do _3___ sessions per day.  http://gt2.exer.us/225   Copyright  VHI. All rights reserved.  Abduction: Clam (Eccentric) - Side-Lying    Lie on side with knees bent. Lift top knee, keeping feet together. Keep trunk steady. Slowly lower for 3-5  Seconds.  Do 5-10 reps.  3x/day. http://ecce.exer.us/64   Copyright  VHI. All rights reserved.  KNEE: Extension, Long Arc Quad (Weight)  Place weight around leg. Raise leg until knee is straight. Hold _5__ seconds. Use ___ lb weight. _10__ reps per set (each leg), 4-5__ sets per day, __7_ days per week     Knee Raise   Lift knee and then lower it. Repeat with other knee. Repeat _10__ times each leg. Do _4-5___ sessions per day.  [email or web address].exer.[facility] [address], [place], [place] [address] Phone # [phone] Fax [phone]

## 2017-10-05 ENCOUNTER — Ambulatory Visit: Payer: 59 | Admitting: Physical Therapy

## 2017-10-05 ENCOUNTER — Encounter: Payer: Self-pay | Admitting: Physical Therapy

## 2017-10-05 DIAGNOSIS — M6281 Muscle weakness (generalized): Secondary | ICD-10-CM | POA: Diagnosis not present

## 2017-10-05 DIAGNOSIS — M62830 Muscle spasm of back: Secondary | ICD-10-CM

## 2017-10-05 DIAGNOSIS — M5441 Lumbago with sciatica, right side: Secondary | ICD-10-CM

## 2017-10-05 DIAGNOSIS — G8929 Other chronic pain: Secondary | ICD-10-CM

## 2017-10-05 NOTE — Therapy (Signed)
[facility] [address] [place], [place], [address] Phone: [phone]   Fax:  [phone]  Physical Therapy Treatment  Patient Details  Name: Brian Tran MRN: [id number] Date of Birth: [birth year] Referring Provider: Jovita Gamma, MD   Encounter Date: 10/05/2017  PT End of Session - 10/05/17 0835    Visit Number  2    Date for PT Re-Evaluation  11/28/17    Authorization Type  [employer]- 60 visit limit    PT Start Time  0756    PT Stop Time  0844    PT Time Calculation (min)  48 min    Activity Tolerance  Patient tolerated treatment well       Past Medical History:  Diagnosis Date  . Anxiety   . DDD (degenerative disc disease), cervical   . GERD (gastroesophageal reflux disease)   . Neuromuscular disorder ([place])    right ulnar nerve pain  . Seasonal allergies     Past Surgical History:  Procedure Laterality Date  . ANTERIOR CERVICAL DECOMPRESSION/DISCECTOMY FUSION 4 LEVELS N/A 10/25/2016   Procedure: ANTERIOR CERVICAL DECOMPRESSION/DISCECTOMY FUSION  CERVICAL THREE-CERVICAL FOUR  CERVICAL FOUR-CERVICAL FIVE ,CERVICAL FIVE-CERVICAL SIX ,CERVICAL SIX-CERVICAL SEVEN;  Surgeon: Jovita Gamma, MD;  Location: [facility];  Service: Neurosurgery;  Laterality: N/A;  . BRONCHOSCOPY    . HERNIA REPAIR Right    inguinal hernia  . WISDOM TOOTH EXTRACTION      There were no vitals filed for this visit.  Subjective Assessment - 10/05/17 0758    Subjective  That (TENS) machine was amazing!   I ordered one from [employer].  My low back is worse in the mornings.      Pertinent History  C3-6 cervical fusion 10/2016, L3-4, L4-5 lumbar decompression and fusion- 03/05/17    Currently in Pain?  Yes    Pain Score  8     Pain Location  Back    Pain Orientation  Right;Lower    Pain Type  Chronic pain                      [facility] Adult PT Treatment/Exercise - 10/05/17 0001      Moist Heat Therapy   Number Minutes Moist  Heat  15 Minutes    Moist Heat Location  Lumbar Spine      Electrical Stimulation   Electrical Stimulation Location  Rt low back    Electrical Stimulation Action  IFC    Electrical Stimulation Parameters  15 min 11 ma    Electrical Stimulation Goals  Pain      Manual Therapy   Soft tissue mobilization  lumbar paraspinals, QL, gluteals       Trigger Point Dry Needling - 10/05/17 0834    Consent Given?  Yes    Education Handout Provided  Yes    Muscles Treated Lower Body  Gluteus minimus;Gluteus maximus bil lumbar multifidi in prone over 1 pillow;  QL in sidelie    Gluteus Maximus Response  Twitch response elicited;Palpable increased muscle length sidelying    Gluteus Minimus Response  Twitch response elicited;Palpable increased muscle length left sidelying           PT Education - 10/05/17 0833    Education provided  Yes    Education Details  dry needling after care;  abdominal brace in supine and hand to knee push    Person(s) Educated  Patient    Methods  Explanation;Demonstration;Handout    Comprehension  Verbalized understanding;Returned demonstration       PT Short Term Goals - 10/03/17 0953      PT SHORT TERM GOAL #1   Title  be independent in initial HEP    Time  4    Period  Weeks    Status  New    Target Date  10/31/17      PT SHORT TERM GOAL #2   Title  report < or = to 5/10 lumbar pain with sitting > 30 minutes    Time  4    Period  Weeks    Status  New    Target Date  10/31/17      PT SHORT TERM GOAL #3   Title  verbalize and demonstrate correct body mechanics modificaions for lumbar protection with lifting and household tasks    Time  4    Period  Weeks    Status  New    Target Date  10/31/17      PT SHORT TERM GOAL #4   Title  demonstrate > or = to 4/5 Lt hip flexor strength to improve transitional motions     Time  4    Period  Weeks    Status  New    Target Date  10/31/17        PT Long Term Goals - 10/03/17 0955      PT LONG TERM  GOAL #1   Title  be independent in advanced HEP    Time  8    Period  Weeks    Status  New    Target Date  11/28/17      PT LONG TERM GOAL #2   Title  reduce FOTO to < or = to 38% limitation    Time  8    Period  Weeks    Status  New    Target Date  11/28/17      PT LONG TERM GOAL #3   Title  sit for 15-20 minutes and report < or = to 3/10 LBP    Time  8    Period  Weeks    Status  New    Target Date  11/28/17      PT LONG TERM GOAL #4   Title  demonstrate 4/5 to 4+/5 Rt hip and knee strength to improve stability and endurance    Time  8    Period  Weeks    Status  New    Target Date  11/28/17      PT LONG TERM GOAL #5   Title  report a 50% reduction in Rt sided LBP with self-care and home tasks    Time  8    Period  Weeks    Status  New    Target Date  11/28/17            Plan - 10/05/17 0836    Clinical Impression Statement  The patient has difficulty tolerating prone but able to tolerate it for a short time with 1 pillow under hips.  Left sidelying over 1 pillow tolerated for a short time as well.  Numerous tender points in right and left multifidi and gluteals.  Improved QL muscle length following manual therapy and dry needling.  Patient states he can already feel a difference following treatment session and demonstrates his ability to flex forward more than he has "in 2 years."    May need a slower progression secondary to pain intensity, chronicity and  numerous myofascial impairments.      Rehab Potential  Good    PT Frequency  2x / week    PT Duration  8 weeks    PT Treatment/Interventions  ADLs/Self Care Home Management;Cryotherapy;Electrical Stimulation;Ultrasound;Moist Heat;Gait training;Functional mobility training;Therapeutic activities;Therapeutic exercise;Patient/family education;Neuromuscular re-education;Manual techniques;Scar mobilization;Passive range of motion;Taping;Dry needling    PT Next Visit Plan  assess response to dry needling to Rt lumbar  paraspinals, quadratus, proximal gluteals #1, body mechanics education, core strength, gentle flexibility, Rt LE strength       Patient will benefit from skilled therapeutic intervention in order to improve the following deficits and impairments:  Pain, Improper body mechanics, Decreased mobility, Decreased scar mobility, Increased muscle spasms  Visit Diagnosis: Muscle weakness (generalized)  Chronic right-sided low back pain with right-sided sciatica  Muscle spasm of back     Problem List Patient Active Problem List   Diagnosis Date Noted  . Dehydration 03/08/2017  . Lumbar stenosis 03/05/2017  . HNP (herniated nucleus pulposus), cervical 10/25/2016    Ruben Im, PT 10/05/17 12:13 PM Phone: [phone] Fax: [phone]  Alvera Singh 10/05/2017, 12:12 PM  [facility] [address] [place], [place], [address] Phone: [phone]   Fax:  [phone]  Name: Brian Tran MRN: [id number] Date of Birth: [birth year]

## 2017-10-05 NOTE — Patient Instructions (Signed)
     Trigger Point Dry Needling  . What is Trigger Point Dry Needling (DN)? o DN is a physical therapy technique used to treat muscle pain and dysfunction. Specifically, DN helps deactivate muscle trigger points (muscle knots).  o A thin filiform needle is used to penetrate the skin and stimulate the underlying trigger point. The goal is for a local twitch response (LTR) to occur and for the trigger point to relax. No medication of any kind is injected during the procedure.   . What Does Trigger Point Dry Needling Feel Like?  o The procedure feels different for each individual patient. Some patients report that they do not actually feel the needle enter the skin and overall the process is not painful. Very mild bleeding may occur. However, many patients feel a deep cramping in the muscle in which the needle was inserted. This is the local twitch response.   . How Will I feel after the treatment? o Soreness is normal, and the onset of soreness may not occur for a few hours. Typically this soreness does not last longer than two days.  o Bruising is uncommon, however; ice can be used to decrease any possible bruising.  o In rare cases feeling tired or nauseous after the treatment is normal. In addition, your symptoms may get worse before they get better, this period will typically not last longer than 24 hours.   . What Can I do After My Treatment? o Increase your hydration by drinking more water for the next 24 hours. o You may place ice or heat on the areas treated that have become sore, however, do not use heat on inflamed or bruised areas. Heat often brings more relief post needling. o You can continue your regular activities, but vigorous activity is not recommended initially after the treatment for 24 hours. o DN is best combined with other physical therapy such as strengthening, stretching, and other therapies.    Stacy Simpson PT [facility] [address], [address] [place],  [address] Phone # [phone] Fax [phone] 

## 2017-10-09 ENCOUNTER — Ambulatory Visit: Payer: 59

## 2017-10-09 DIAGNOSIS — M62830 Muscle spasm of back: Secondary | ICD-10-CM

## 2017-10-09 DIAGNOSIS — M6281 Muscle weakness (generalized): Secondary | ICD-10-CM

## 2017-10-09 DIAGNOSIS — M5441 Lumbago with sciatica, right side: Secondary | ICD-10-CM

## 2017-10-09 DIAGNOSIS — G8929 Other chronic pain: Secondary | ICD-10-CM

## 2017-10-09 NOTE — Therapy (Signed)
[facility] [address] [place], [place], [address] Phone: [phone]   Fax:  [phone]  Physical Therapy Treatment  Patient Details  Name: SAMAD THON MRN: [id number] Date of Birth: [birth year] Referring Provider: Jovita Gamma, MD   Encounter Date: 10/09/2017  PT End of Session - 10/09/17 0931    Visit Number  3    Date for PT Re-Evaluation  11/28/17    Authorization Type  [employer]- 60 visit limit    PT Start Time  0847    PT Stop Time  0930    PT Time Calculation (min)  43 min    Activity Tolerance  Patient tolerated treatment well    Behavior During Therapy  [facility] for tasks assessed/performed       Past Medical History:  Diagnosis Date  . Anxiety   . DDD (degenerative disc disease), cervical   . GERD (gastroesophageal reflux disease)   . Neuromuscular disorder ([place])    right ulnar nerve pain  . Seasonal allergies     Past Surgical History:  Procedure Laterality Date  . ANTERIOR CERVICAL DECOMPRESSION/DISCECTOMY FUSION 4 LEVELS N/A 10/25/2016   Procedure: ANTERIOR CERVICAL DECOMPRESSION/DISCECTOMY FUSION  CERVICAL THREE-CERVICAL FOUR  CERVICAL FOUR-CERVICAL FIVE ,CERVICAL FIVE-CERVICAL SIX ,CERVICAL SIX-CERVICAL SEVEN;  Surgeon: Jovita Gamma, MD;  Location: [place];  Service: Neurosurgery;  Laterality: N/A;  . BRONCHOSCOPY    . HERNIA REPAIR Right    inguinal hernia  . WISDOM TOOTH EXTRACTION      There were no vitals filed for this visit.  Subjective Assessment - 10/09/17 0849    Subjective  I loved the dry needling.  I got good relief.      Pertinent History  C3-6 cervical fusion 10/2016, L3-4, L4-5 lumbar decompression and fusion- 03/05/17    Currently in Pain?  Yes    Pain Score  6     Pain Location  Back    Pain Orientation  Right;Lower    Pain Descriptors / Indicators  Aching;Dull    Pain Onset  More than a month ago    Pain Frequency  Constant    Aggravating Factors   sitting too long,  standing too long, lifting heavy objects    Pain Relieving Factors  change of position, heating pad                      [facility] Adult PT Treatment/Exercise - 10/09/17 0001      Exercises   Exercises  Lumbar;Knee/Hip      Lumbar Exercises: Stretches   Active Hamstring Stretch  Right;Left;2 reps;20 seconds    Single Knee to Chest Stretch  Right;Left;3 reps;20 seconds      Lumbar Exercises: Aerobic   Nustep  Level 2x 8 minutes arms/legs.  PT present to discuss progress      Lumbar Exercises: Seated   Long Arc Quad on Chair  Strengthening;Both;2 sets;10 reps    Other Seated Lumbar Exercises  marching 2x10      Manual Therapy   Soft tissue mobilization  lumbar paraspinals bil, Rt quadratus.       Trigger Point Dry Needling - 10/09/17 0931    Consent Given?  Yes    Muscles Treated Upper Body  Quadratus Lumborum Rt only    Muscles Treated Lower Body  -- bil lumbar multifidi             PT Short Term Goals - 10/03/17 9381  PT SHORT TERM GOAL #1   Title  be independent in initial HEP    Time  4    Period  Weeks    Status  New    Target Date  10/31/17      PT SHORT TERM GOAL #2   Title  report < or = to 5/10 lumbar pain with sitting > 30 minutes    Time  4    Period  Weeks    Status  New    Target Date  10/31/17      PT SHORT TERM GOAL #3   Title  verbalize and demonstrate correct body mechanics modificaions for lumbar protection with lifting and household tasks    Time  4    Period  Weeks    Status  New    Target Date  10/31/17      PT SHORT TERM GOAL #4   Title  demonstrate > or = to 4/5 Lt hip flexor strength to improve transitional motions     Time  4    Period  Weeks    Status  New    Target Date  10/31/17        PT Long Term Goals - 10/03/17 0955      PT LONG TERM GOAL #1   Title  be independent in advanced HEP    Time  8    Period  Weeks    Status  New    Target Date  11/28/17      PT LONG TERM GOAL #2   Title  reduce  FOTO to < or = to 38% limitation    Time  8    Period  Weeks    Status  New    Target Date  11/28/17      PT LONG TERM GOAL #3   Title  sit for 15-20 minutes and report < or = to 3/10 LBP    Time  8    Period  Weeks    Status  New    Target Date  11/28/17      PT LONG TERM GOAL #4   Title  demonstrate 4/5 to 4+/5 Rt hip and knee strength to improve stability and endurance    Time  8    Period  Weeks    Status  New    Target Date  11/28/17      PT LONG TERM GOAL #5   Title  report a 50% reduction in Rt sided LBP with self-care and home tasks    Time  8    Period  Weeks    Status  New    Target Date  11/28/17            Plan - 10/09/17 9024    Clinical Impression Statement  Pt reported increased ease of movement and reduced pain after dry needling last session.  Pt was able to sit longer in church and in the waiting room today.  Pt with tension and trigger points in Rt quadratus, lumbar paraspinals and multifidi.  Pt with Rt LE weakness and is doing exercise to address this.  Pt will continue to benefit from skilled PT for LE strength, core strength and manual to address trigger points.      Rehab Potential  Good    PT Frequency  2x / week    PT Duration  8 weeks    PT Treatment/Interventions  ADLs/Self Care Home Management;Cryotherapy;Electrical Stimulation;Ultrasound;Moist Heat;Gait training;Functional mobility  training;Therapeutic activities;Therapeutic exercise;Patient/family education;Neuromuscular re-education;Manual techniques;Scar mobilization;Passive range of motion;Taping;Dry needling    PT Next Visit Plan  assess response to dry needling to Rt lumbar paraspinals, quadratus, proximal gluteals #2, body mechanics education, core strength, gentle flexibility, Rt LE strength    Consulted and Agree with Plan of Care  Patient       Patient will benefit from skilled therapeutic intervention in order to improve the following deficits and impairments:  Pain, Improper body  mechanics, Decreased mobility, Decreased scar mobility, Increased muscle spasms  Visit Diagnosis: Muscle weakness (generalized)  Chronic right-sided low back pain with right-sided sciatica  Muscle spasm of back     Problem List Patient Active Problem List   Diagnosis Date Noted  . Dehydration 03/08/2017  . Lumbar stenosis 03/05/2017  . HNP (herniated nucleus pulposus), cervical 10/25/2016    Sigurd Sos, PT 10/09/17 9:37 AM   [facility] [address] [place], [place], [address] Phone: [phone]   Fax:  [phone]  Name: BAIN WHICHARD MRN: [id number] Date of Birth: 1975/11/24

## 2017-10-11 ENCOUNTER — Ambulatory Visit: Payer: 59

## 2017-10-11 DIAGNOSIS — M6281 Muscle weakness (generalized): Secondary | ICD-10-CM

## 2017-10-11 DIAGNOSIS — G8929 Other chronic pain: Secondary | ICD-10-CM

## 2017-10-11 DIAGNOSIS — M62830 Muscle spasm of back: Secondary | ICD-10-CM

## 2017-10-11 DIAGNOSIS — M5441 Lumbago with sciatica, right side: Secondary | ICD-10-CM

## 2017-10-11 NOTE — Therapy (Signed)
[facility] [address] [place], [place], [address] Phone: [phone]   Fax:  [phone]  Physical Therapy Treatment  Patient Details  Name: Brian Tran MRN: [id number] Date of Birth: November 21, 1975 Referring Provider: Jovita Gamma, MD   Encounter Date: 10/11/2017  PT End of Session - 10/11/17 0931    Visit Number  4    Date for PT Re-Evaluation  11/28/17    Authorization Type  U[employer]- 60 visit limit    PT Start Time  0846    PT Stop Time  0929    PT Time Calculation (min)  43 min    Activity Tolerance  Patient tolerated treatment well    Behavior During Therapy  [facility] for tasks assessed/performed       Past Medical History:  Diagnosis Date  . Anxiety   . DDD (degenerative disc disease), cervical   . GERD (gastroesophageal reflux disease)   . Neuromuscular disorder ([place])    right ulnar nerve pain  . Seasonal allergies     Past Surgical History:  Procedure Laterality Date  . ANTERIOR CERVICAL DECOMPRESSION/DISCECTOMY FUSION 4 LEVELS N/A 10/25/2016   Procedure: ANTERIOR CERVICAL DECOMPRESSION/DISCECTOMY FUSION  CERVICAL THREE-CERVICAL FOUR  CERVICAL FOUR-CERVICAL FIVE ,CERVICAL FIVE-CERVICAL SIX ,CERVICAL SIX-CERVICAL SEVEN;  Surgeon: Jovita Gamma, MD;  Location: [place];  Service: Neurosurgery;  Laterality: N/A;  . BRONCHOSCOPY    . HERNIA REPAIR Right    inguinal hernia  . WISDOM TOOTH EXTRACTION      There were no vitals filed for this visit.  Subjective Assessment - 10/11/17 0851    Subjective  I am feeling better.  I'm a little bit sore.  I'm am sore on my Lt side now.      Patient Stated Goals  not take medicine, reduce LBP    Currently in Pain?  Yes    Pain Score  3     Pain Location  Back    Pain Orientation  Right;Left    Pain Descriptors / Indicators  Aching;Dull    Pain Type  Chronic pain    Pain Onset  More than a month ago                      [facility] Adult PT  Treatment/Exercise - 10/11/17 0001      Lumbar Exercises: Stretches   Active Hamstring Stretch  Right;Left;2 reps;20 seconds    Single Knee to Chest Stretch  Right;Left;3 reps;20 seconds      Lumbar Exercises: Aerobic   UBE (Upper Arm Bike)  Level 2x 6 minutes (3/3) seated on blue ball      Lumbar Exercises: Seated   Long Arc Quad on Chair  Strengthening;Both;2 sets;10 reps;Weights    LAQ on Chair Weights (lbs)  2    Other Seated Lumbar Exercises  marching 2x10 2# added      Manual Therapy   Soft tissue mobilization  lumbar paraspinals Lt and Lt quadratus       Trigger Point Dry Needling - 10/11/17 0932    Consent Given?  Yes    Muscles Treated Upper Body  Quadratus Lumborum Lt only    Muscles Treated Lower Body  Gluteus minimus Lt only           PT Education - 10/11/17 0906    Education provided  Yes    Education Details  body mechanics education    Person(s) Educated  Patient    Methods  Explanation;Demonstration;Handout  Comprehension  Verbalized understanding       PT Short Term Goals - 10/11/17 0906      PT SHORT TERM GOAL #3   Title  verbalize and demonstrate correct body mechanics modificaions for lumbar protection with lifting and household tasks    Status  Achieved        PT Long Term Goals - 10/03/17 0955      PT LONG TERM GOAL #1   Title  be independent in advanced HEP    Time  8    Period  Weeks    Status  New    Target Date  11/28/17      PT LONG TERM GOAL #2   Title  reduce FOTO to < or = to 38% limitation    Time  8    Period  Weeks    Status  New    Target Date  11/28/17      PT LONG TERM GOAL #3   Title  sit for 15-20 minutes and report < or = to 3/10 LBP    Time  8    Period  Weeks    Status  New    Target Date  11/28/17      PT LONG TERM GOAL #4   Title  demonstrate 4/5 to 4+/5 Rt hip and knee strength to improve stability and endurance    Time  8    Period  Weeks    Status  New    Target Date  11/28/17      PT LONG  TERM GOAL #5   Title  report a 50% reduction in Rt sided LBP with self-care and home tasks    Time  8    Period  Weeks    Status  New    Target Date  11/28/17            Plan - 10/11/17 0853    Clinical Impression Statement  Pt with significant pain reduction this week.  Pt rated pain as 3-4/10 today.  Pt reports continued Lt quadratus pain and PT dry needled and provided manual therapy to this today.  Pt with tension and trigger points in the Lt quadratus.  Pt with endurance, core strength, and Rt LE strength deficits and will continue to benefit from skilled PT for strength, flexibility and dry needling/manual therapy.      Rehab Potential  Good    PT Frequency  2x / week    PT Duration  8 weeks    PT Treatment/Interventions  ADLs/Self Care Home Management;Cryotherapy;Electrical Stimulation;Ultrasound;Moist Heat;Gait training;Functional mobility training;Therapeutic activities;Therapeutic exercise;Patient/family education;Neuromuscular re-education;Manual techniques;Scar mobilization;Passive range of motion;Taping;Dry needling    PT Next Visit Plan  assess response to dry needling to Lt, quadratus, body mechanics education, core strength, gentle flexibility, Rt LE strength    Consulted and Agree with Plan of Care  Patient       Patient will benefit from skilled therapeutic intervention in order to improve the following deficits and impairments:  Pain, Improper body mechanics, Decreased mobility, Decreased scar mobility, Increased muscle spasms  Visit Diagnosis: Muscle weakness (generalized)  Chronic right-sided low back pain with right-sided sciatica  Muscle spasm of back     Problem List Patient Active Problem List   Diagnosis Date Noted  . Dehydration 03/08/2017  . Lumbar stenosis 03/05/2017  . HNP (herniated nucleus pulposus), cervical 10/25/2016    Sigurd Sos, PT 10/11/17 9:33 AM  [facility] [address]  [address] [place], [place], [address] Phone: [phone]   Fax:  [phone]  Name: Brian Tran MRN: [id number] Date of Birth: [birth year]

## 2017-10-11 NOTE — Patient Instructions (Addendum)
   Lifting Principles  Maintain proper posture and head alignment. Slide object as close as possible before lifting. Move obstacles out of the way. Test before lifting; ask for help if too heavy. Tighten stomach muscles without holding breath. Use smooth movements; do not jerk. Use legs to do the work, and pivot with feet. Distribute the work load symmetrically and close to the center of trunk. Push instead of pull whenever possible.   Squat down and hold basket close to stand. Use leg muscles to do the work.    Avoid twisting or bending back. Pivot around using foot movements, and bend at knees if needed when reaching for articles.        Getting Into / Out of Bed   Lower self to lie down on one side by raising legs and lowering head at the same time. Use arms to assist moving without twisting. Bend both knees to roll onto back if desired. To sit up, start from lying on side, and use same move-ments in reverse. Keep trunk aligned with legs.    Shift weight from front foot to back foot as item is lifted off shelf.    When leaning forward to pick object up from floor, extend one leg out behind. Keep back straight. Hold onto a sturdy support with other hand.      Sit upright, head facing forward. Try using a roll to support lower back. Keep shoulders relaxed, and avoid rounded back. Keep hips level with knees. Avoid crossing legs for long periods.     [facility] [address], [address] [place], [place] [address] Phone # [phone] Fax [phone]  

## 2017-10-15 ENCOUNTER — Encounter: Payer: Self-pay | Admitting: Physical Therapy

## 2017-10-15 ENCOUNTER — Ambulatory Visit: Payer: 59 | Admitting: Physical Therapy

## 2017-10-15 DIAGNOSIS — M5441 Lumbago with sciatica, right side: Secondary | ICD-10-CM

## 2017-10-15 DIAGNOSIS — M6281 Muscle weakness (generalized): Secondary | ICD-10-CM

## 2017-10-15 DIAGNOSIS — G8929 Other chronic pain: Secondary | ICD-10-CM

## 2017-10-15 DIAGNOSIS — M62830 Muscle spasm of back: Secondary | ICD-10-CM

## 2017-10-15 NOTE — Therapy (Signed)
[facility] [address] [place], [place], [address] Phone: [phone]   Fax:  [phone]  Physical Therapy Treatment  Patient Details  Name: Brian Tran MRN: [id number] Date of Birth: 1976/03/30 Referring Provider: Jovita Gamma, MD   Encounter Date: 10/15/2017  PT End of Session - 10/15/17 0753    Visit Number  5    Date for PT Re-Evaluation  11/28/17    Authorization Type  [employer]- 60 visit limit    PT Start Time  0753    PT Stop Time  0900    PT Time Calculation (min)  67 min    Activity Tolerance  Patient tolerated treatment well    Behavior During Therapy  [facility] for tasks assessed/performed       Past Medical History:  Diagnosis Date  . Anxiety   . DDD (degenerative disc disease), cervical   . GERD (gastroesophageal reflux disease)   . Neuromuscular disorder ([place])    right ulnar nerve pain  . Seasonal allergies     Past Surgical History:  Procedure Laterality Date  . ANTERIOR CERVICAL DECOMPRESSION/DISCECTOMY FUSION 4 LEVELS N/A 10/25/2016   Procedure: ANTERIOR CERVICAL DECOMPRESSION/DISCECTOMY FUSION  CERVICAL THREE-CERVICAL FOUR  CERVICAL FOUR-CERVICAL FIVE ,CERVICAL FIVE-CERVICAL SIX ,CERVICAL SIX-CERVICAL SEVEN;  Surgeon: Brian Gamma, MD;  Location: [place];  Service: Neurosurgery;  Laterality: N/A;  . BRONCHOSCOPY    . HERNIA REPAIR Right    inguinal hernia  . WISDOM TOOTH EXTRACTION      There were no vitals filed for this visit.  Subjective Assessment - 10/15/17 0753    Subjective  I have some really good times and then my pain comes back.     Pertinent History  C3-6 cervical fusion 10/2016, L3-4, L4-5 lumbar decompression and fusion- 03/05/17    Limitations  Sitting    Currently in Pain?  Yes    Pain Score  5     Pain Location  Back    Pain Orientation  Right;Left    Aggravating Factors   Same    Pain Relieving Factors  changing positions    Multiple Pain Sites  No                       [facility] Adult PT Treatment/Exercise - 10/15/17 0001      Lumbar Exercises: Aerobic   Nustep  L2 x 10 min PTA present for status      Lumbar Exercises: Standing   Other Standing Lumbar Exercises  Side stretch bil 2x       Lumbar Exercises: Supine   Other Supine Lumbar Exercises  Leg lengthener stretch Bil 4x       Lumbar Exercises: Quadruped   Madcat/Old Horse  5 reps      Knee/Hip Exercises: Sidelying   Hip ABduction  Strengthening;Both;1 set;10 reps      Moist Heat Therapy   Number Minutes Moist Heat  15 Minutes    Moist Heat Location  Lumbar Spine      Electrical Stimulation   Electrical Stimulation Location  Lumbar Bil    Electrical Stimulation Action  IFC    Electrical Stimulation Parameters  80-150 HZ Lt sidelying with pillows bt legs    Electrical Stimulation Goals  Pain             PT Education - 10/15/17 0818    Education provided  Yes    Education Details  BODY mechanics ed and review, HEp progression  Person(s) Educated  Patient    Methods  Explanation;Demonstration;Tactile cues;Verbal cues;Handout    Comprehension  Verbalized understanding;Returned demonstration       PT Short Term Goals - 10/11/17 0906      PT SHORT TERM GOAL #3   Title  verbalize and demonstrate correct body mechanics modificaions for lumbar protection with lifting and household tasks    Status  Achieved        PT Long Term Goals - 10/03/17 0955      PT LONG TERM GOAL #1   Title  be independent in advanced HEP    Time  8    Period  Weeks    Status  New    Target Date  11/28/17      PT LONG TERM GOAL #2   Title  reduce FOTO to < or = to 38% limitation    Time  8    Period  Weeks    Status  New    Target Date  11/28/17      PT LONG TERM GOAL #3   Title  sit for 15-20 minutes and report < or = to 3/10 LBP    Time  8    Period  Weeks    Status  New    Target Date  11/28/17      PT LONG TERM GOAL #4   Title  demonstrate 4/5 to 4+/5  Rt hip and knee strength to improve stability and endurance    Time  8    Period  Weeks    Status  New    Target Date  11/28/17      PT LONG TERM GOAL #5   Title  report a 50% reduction in Rt sided LBP with self-care and home tasks    Time  8    Period  Weeks    Status  New    Target Date  11/28/17            Plan - 10/15/17 0753    Clinical Impression Statement  Pt was educated in postural habits and exercises to help inhibit the QL bilaterally. Upon performing the exercises pt did not exacerbate his pain. We used Estim for his pain at the end of the session today.  Pt was given a HEP progression today.     Rehab Potential  Good    PT Frequency  2x / week    PT Duration  8 weeks    PT Treatment/Interventions  ADLs/Self Care Home Management;Cryotherapy;Electrical Stimulation;Ultrasound;Moist Heat;Gait training;Functional mobility training;Therapeutic activities;Therapeutic exercise;Patient/family education;Neuromuscular re-education;Manual techniques;Scar mobilization;Passive range of motion;Taping;Dry needling    PT Next Visit Plan  Review sidelyling exercise to see if pt getting better at lengthening the sides of his body and not shortening the QL. DN if seen by PT, and progress core strength and spine ROM.     Consulted and Agree with Plan of Care  Patient       Patient will benefit from skilled therapeutic intervention in order to improve the following deficits and impairments:  Pain, Improper body mechanics, Decreased mobility, Decreased scar mobility, Increased muscle spasms  Visit Diagnosis: Muscle weakness (generalized)  Chronic right-sided low back pain with right-sided sciatica  Muscle spasm of back     Problem List Patient Active Problem List   Diagnosis Date Noted  . Dehydration 03/08/2017  . Lumbar stenosis 03/05/2017  . HNP (herniated nucleus pulposus), cervical 10/25/2016    [name], PTA 10/15/2017, 8:50 AM  [facility] [address] [place], [place], [address] Phone: [phone]   Fax:  [phone]  Name: Brian Tran MRN: [id number] Date of Birth: May 12, 1976

## 2017-10-15 NOTE — Patient Instructions (Signed)
Leg lengthener stretch in the morning: - One leg lengthens out in the bed, the other knee remains bent, stretch the toes back to the knee cap as you slide the heel away, feeling the thigh bone stretch long. Do 2-3 x a side   Standing side stretch:   Good to do at work, fairly often. Root the heel bone down to the floor as you reach your arms up and over, side bending your spine. You are staying long, reaching to the sky. BREATHE! Hold for a breath. Do 1-2x but do often.    Sidelying leg lift:  Lay on your side: hips are stacked on top of each other. Bottom  Knee is bent, back is straight, lift your bottom waist as you lengthen the top pelvis away from your back, lift the lower abs up and in. Once you do ALL of this, then you can lift the leg 5-10x SLOWLY...... Making sure you are only moving the thigh bone.   Cat/Camel stretch on all 4's: Round and arch the back 5x.

## 2017-10-17 ENCOUNTER — Ambulatory Visit: Payer: 59

## 2017-10-17 DIAGNOSIS — M6281 Muscle weakness (generalized): Secondary | ICD-10-CM

## 2017-10-17 DIAGNOSIS — M62830 Muscle spasm of back: Secondary | ICD-10-CM

## 2017-10-17 DIAGNOSIS — M5441 Lumbago with sciatica, right side: Secondary | ICD-10-CM

## 2017-10-17 DIAGNOSIS — G8929 Other chronic pain: Secondary | ICD-10-CM

## 2017-10-17 NOTE — Therapy (Addendum)
[facility] [address] [place], [place], [address] Phone: [phone]   Fax:  [phone]  Physical Therapy Treatment  Patient Details  Name: Brian Tran MRN: [id number] Date of Birth: Jun 15, 1976 Referring Provider: Jovita Gamma, MD   Encounter Date: 10/17/2017  PT End of Session - 10/17/17 0915    Visit Number  6    Date for PT Re-Evaluation  11/28/17    Authorization Type  [employer]- 60 visit limit    PT Start Time  0821    PT Stop Time  0912    PT Time Calculation (min)  51 min    Activity Tolerance  Patient tolerated treatment well    Behavior During Therapy  [facility] for tasks assessed/performed       Past Medical History:  Diagnosis Date  . Anxiety   . DDD (degenerative disc disease), cervical   . GERD (gastroesophageal reflux disease)   . Neuromuscular disorder ([place])    right ulnar nerve pain  . Seasonal allergies     Past Surgical History:  Procedure Laterality Date  . ANTERIOR CERVICAL DECOMPRESSION/DISCECTOMY FUSION 4 LEVELS N/A 10/25/2016   Procedure: ANTERIOR CERVICAL DECOMPRESSION/DISCECTOMY FUSION  CERVICAL THREE-CERVICAL FOUR  CERVICAL FOUR-CERVICAL FIVE ,CERVICAL FIVE-CERVICAL SIX ,CERVICAL SIX-CERVICAL SEVEN;  Surgeon: Jovita Gamma, MD;  Location: [place];  Service: Neurosurgery;  Laterality: N/A;  . BRONCHOSCOPY    . HERNIA REPAIR Right    inguinal hernia  . WISDOM TOOTH EXTRACTION      There were no vitals filed for this visit.  Subjective Assessment - 10/17/17 0824    Subjective  I am having Rt sided pain today.  Dry needling has really helped.  I feel 10-20% better.      Pertinent History  C3-6 cervical fusion 10/2016, L3-4, L4-5 lumbar decompression and fusion- 03/05/17    Currently in Pain?  Yes    Pain Score  7     Pain Location  Back    Pain Orientation  Right    Pain Descriptors / Indicators  Aching;Dull         [facility] PT Assessment - 10/17/17 0001      Strength   Right  Hip Flexion  4/5    Right Hip Extension  4-/5    Right Hip ABduction  4+/5                  [facility] Adult PT Treatment/Exercise - 10/17/17 0001      Lumbar Exercises: Stretches   Active Hamstring Stretch  Right;Left;2 reps;20 seconds      Lumbar Exercises: Aerobic   Nustep  L5 x 8 min  Legs only PT present for status      Lumbar Exercises: Standing   Other Standing Lumbar Exercises  Side stretch bil 2x       Lumbar Exercises: Supine   Other Supine Lumbar Exercises  Leg lengthener stretch Bil 4x       Lumbar Exercises: Prone   Straight Leg Raise  20 reps Rt only.  Verbal cues to reduce abduction      Knee/Hip Exercises: Sidelying   Hip ABduction  Strengthening;Both;1 set;10 reps      Manual Therapy   Soft tissue mobilization  lumbar paraspinals and Rt quadratus       Trigger Point Dry Needling - 10/17/17 0913    Consent Given?  Yes    Muscles Treated Upper Body  Quadratus Lumborum Rt quadratus, bil lumbar paraspinals  PT Short Term Goals - 10/17/17 0840      PT SHORT TERM GOAL #4   Title  demonstrate > or = to 4/5 Lt hip flexor strength to improve transitional motions     Status  Achieved        PT Long Term Goals - 10/03/17 0955      PT LONG TERM GOAL #1   Title  be independent in advanced HEP    Time  8    Period  Weeks    Status  New    Target Date  11/28/17      PT LONG TERM GOAL #2   Title  reduce FOTO to < or = to 38% limitation    Time  8    Period  Weeks    Status  New    Target Date  11/28/17      PT LONG TERM GOAL #3   Title  sit for 15-20 minutes and report < or = to 3/10 LBP    Time  8    Period  Weeks    Status  New    Target Date  11/28/17      PT LONG TERM GOAL #4   Title  demonstrate 4/5 to 4+/5 Rt hip and knee strength to improve stability and endurance    Time  8    Period  Weeks    Status  New    Target Date  11/28/17      PT LONG TERM GOAL #5   Title  report a 50% reduction in Rt sided LBP with  self-care and home tasks    Time  8    Period  Weeks    Status  New    Target Date  11/28/17            Plan - 10/17/17 0825    Clinical Impression Statement  Pt with increased pain today and reports 10-20% overall improvement since the start of care.  Rt LE strength is improved from initial evaluation.  Pt with tension and trigger points in Rt QL and lumbar paraspinals.  Pt with improved tissue mobility and reduced pain after treatment today.  Pt with continued Rt LE weakness and is working on [employer] for strength and flexibilty gains.  Pt will continue to benefit from skilled PT for LE strength, core strength and hip/lumbar flexibility.      Rehab Potential  Good    PT Frequency  2x / week    PT Duration  8 weeks    PT Treatment/Interventions  ADLs/Self Care Home Management;Cryotherapy;Electrical Stimulation;Ultrasound;Moist Heat;Gait training;Functional mobility training;Therapeutic activities;Therapeutic exercise;Patient/family education;Neuromuscular re-education;Manual techniques;Scar mobilization;Passive range of motion;Taping;Dry needling    PT Next Visit Plan  assess response to dry needling, QL flexibility, Rt LE strength    Recommended Other Services  inial certification is signed    Consulted and Agree with Plan of Care  Patient       Patient will benefit from skilled therapeutic intervention in order to improve the following deficits and impairments:  Pain, Improper body mechanics, Decreased mobility, Decreased scar mobility, Increased muscle spasms  Visit Diagnosis: Muscle weakness (generalized)  Chronic right-sided low back pain with right-sided sciatica  Muscle spasm of back     Problem List Patient Active Problem List   Diagnosis Date Noted  . Dehydration 03/08/2017  . Lumbar stenosis 03/05/2017  . HNP (herniated nucleus pulposus), cervical 10/25/2016     Sigurd Sos, PT 10/17/17 9:16 AM PHYSICAL  THERAPY DISCHARGE SUMMARY  Visits from Start of Care:  6  Current functional level related to goals / functional outcomes: Pt didn't return after visit on 10/17/17.  He was contacted and didn't call to schedule further appointments.     Remaining deficits: See above for most current status.     Education / Equipment: HEP, Economist Plan: Patient agrees to discharge.  Patient goals were partially met. Patient is being discharged due to not returning since the last visit.  ?????        Sigurd Sos, PT 11/27/17 8:52 AM   [facility] [address] [place], [place], [address] Phone: [phone]   Fax:  [phone]  Name: Brian Tran MRN: [id number] Date of Birth: 1976-05-07

## 2017-10-23 ENCOUNTER — Encounter: Payer: 59 | Admitting: Physical Therapy

## 2018-08-13 ENCOUNTER — Other Ambulatory Visit: Payer: Self-pay | Admitting: Neurosurgery

## 2018-08-13 DIAGNOSIS — M4712 Other spondylosis with myelopathy, cervical region: Secondary | ICD-10-CM

## 2018-08-13 DIAGNOSIS — Z981 Arthrodesis status: Secondary | ICD-10-CM

## 2018-08-19 ENCOUNTER — Ambulatory Visit
Admission: RE | Admit: 2018-08-19 | Discharge: 2018-08-19 | Disposition: A | Discharge status: Home / Self Care | Payer: 59 | Source: Ambulatory Visit | Attending: Neurosurgery | Admitting: Neurosurgery

## 2018-08-19 DIAGNOSIS — Z981 Arthrodesis status: Secondary | ICD-10-CM

## 2018-08-19 DIAGNOSIS — M4712 Other spondylosis with myelopathy, cervical region: Secondary | ICD-10-CM

## 2019-01-16 IMAGING — CT CT CERVICAL SPINE W/O CM
2 series · 10 of 14 positions shown, 11 images · non-contrast
Comparison: [HOSPITAL] neurosurgery cervical spine MRI 10/19/2016 and
earlier.

CLINICAL DATA: 40-year-old male with 2-3 years of cervical neck
pain with no known injury. Neck tightness. Numbness in weakness in
the right hand.

Abnormal cervical spine MRI earlier this week raising the
possibility of Ossification of the posterior longitudinal ligament
(OPLL). Subsequent encounter.
EXAM:
CT CERVICAL SPINE WITHOUT CONTRAST
TECHNIQUE: Multidetector CT imaging of the cervical spine was performed without
intravenous contrast. Multiplanar CT image reconstructions were also
generated.

[Series 3: cspine soft · axial · 0.34mm/px · z∈[-269,-111]mm · 6 of 111 slices shown]
[im 16/111  soft-tissue]
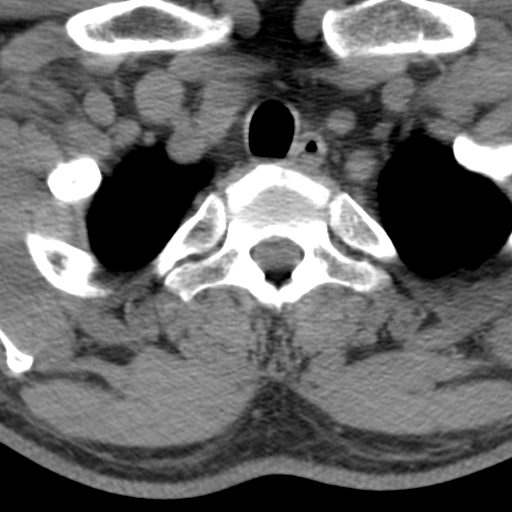
[im 32/111  soft-tissue]
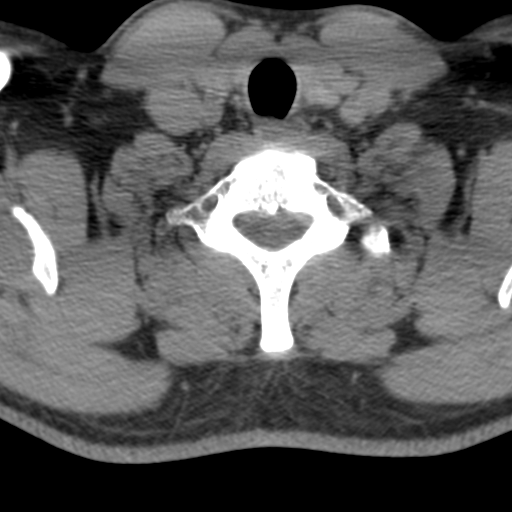
[im 48/111  soft-tissue]
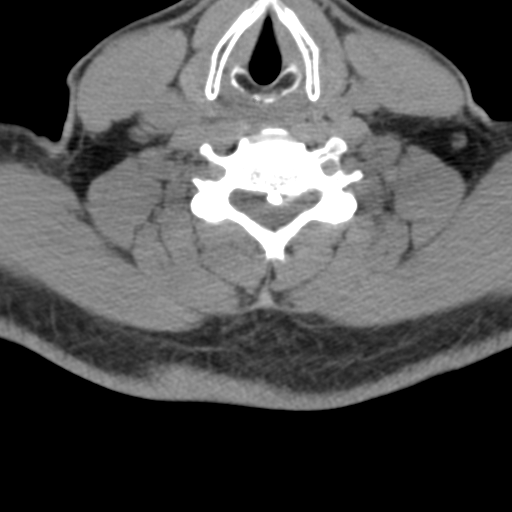
[im 63/111  soft-tissue]
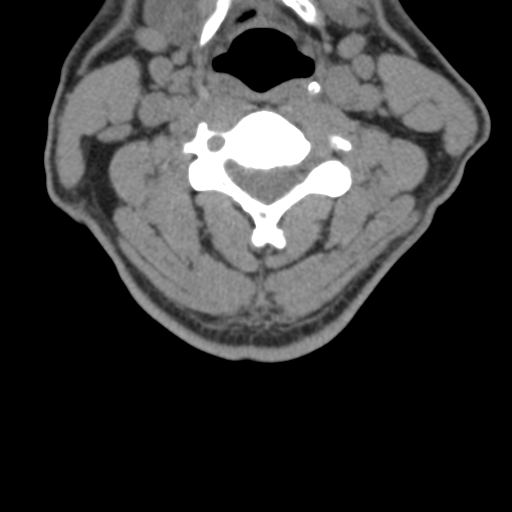
[im 79/111  soft-tissue]
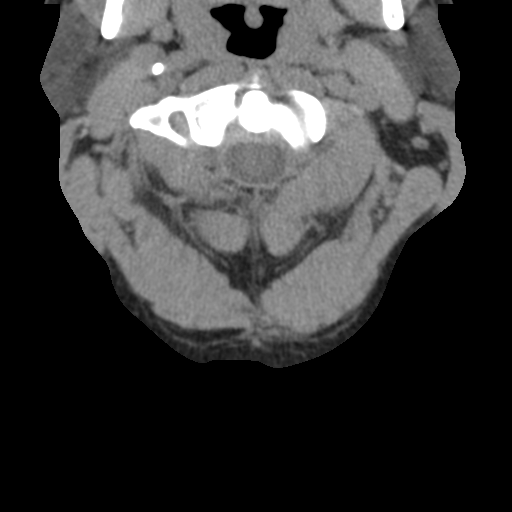
[im 95/111  soft-tissue]
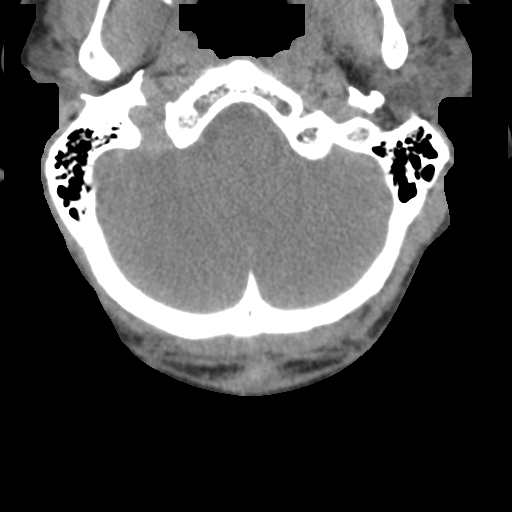

[Series 9: angled axial · axial · 0.26mm/px · z∈[-203,-116]mm · 4 of 76 slices shown, 5 images]
[im 16/76  soft-tissue]
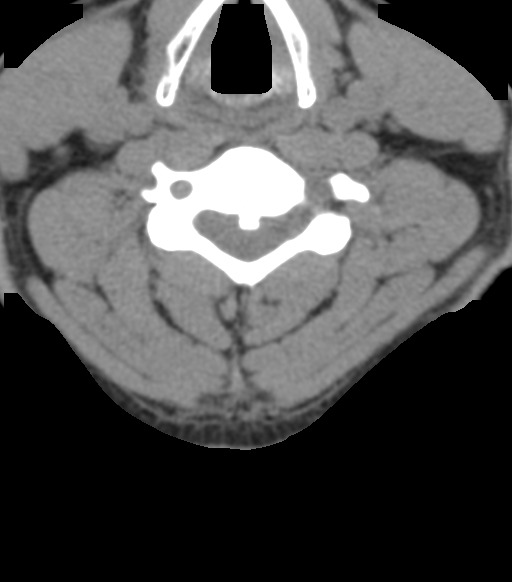
[im 16/76  bone]
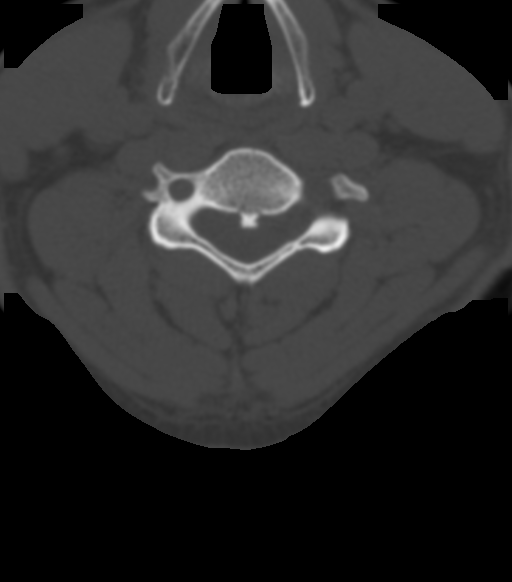
[im 31/76  bone]
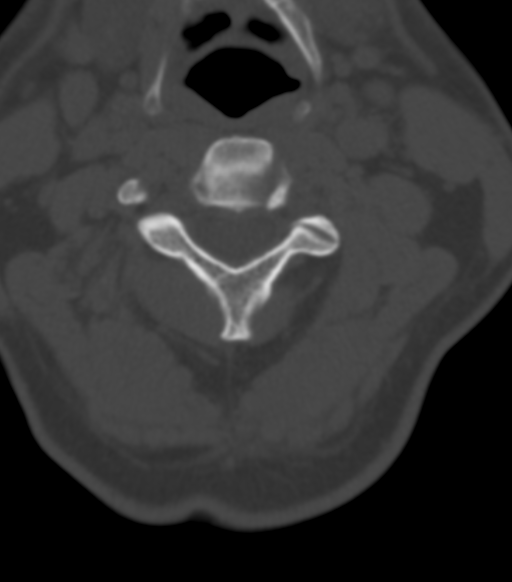
[im 46/76  bone]
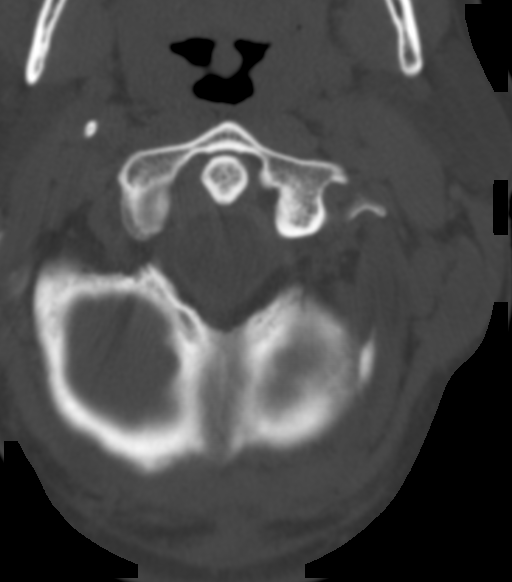
[im 61/76  bone]
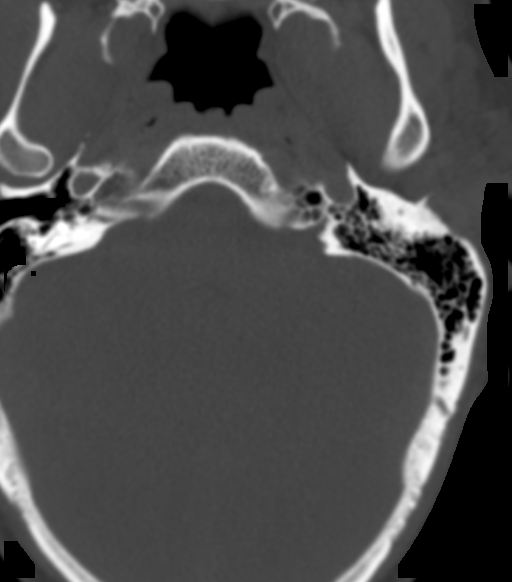

[10 of 14 positions shown; findings below may reference images not displayed]

FINDINGS: Alignment: Stable straightening and mild reversal of cervical
lordosis from the recent MRI.

Skull base and vertebrae: Visualized skull base is intact. No
atlanto-occipital dissociation. Cervicothoracic junction alignment
is within normal limits. Bilateral posterior element alignment is
within normal limits.

Soft tissues and spinal canal: Negative visualized noncontrast brain
parenchyma. Negative noncontrast neck soft tissues.

Disc levels: CT does confirm the presence of intermittent
ossification of the posterior longitudinal ligament (sagittal image
44) in addition to the disc space findings described by MRI
yesterday. There is Ossification of the posterior longitudinal
ligament at C4 (series 4, image 56), C5 (series 4, image 64) and C6
(image 72) contributing to spinal stenosis with spinal cord mass
effect at those levels.

There is superimposed degenerative vertebral endplate spurring at
C5-C6 and C6-C7. There is minimal OPLL along the posterosuperior C7
vertebral body.

Upper chest: Upper thoracic levels appear intact. Negative lung
apices and visualized noncontrast superior mediastinum.
IMPRESSION: Confirmed intermittent ossification of the posterior longitudinal
ligament - posterior to the C4, the C5, and C6 vertebral bodies -
contributing to the multifactorial spinal stenosis demonstrated by
MRI yesterday. See sagittal image 44.

## 2019-01-21 IMAGING — CR DG CERVICAL SPINE 2 OR 3 VIEWS
1 series · 1 of 1 positions shown · non-contrast
Comparison: 10/20/2016

CLINICAL DATA: Cervical fusion

EXAM:
CERVICAL SPINE - 2 VIEW

[AP]
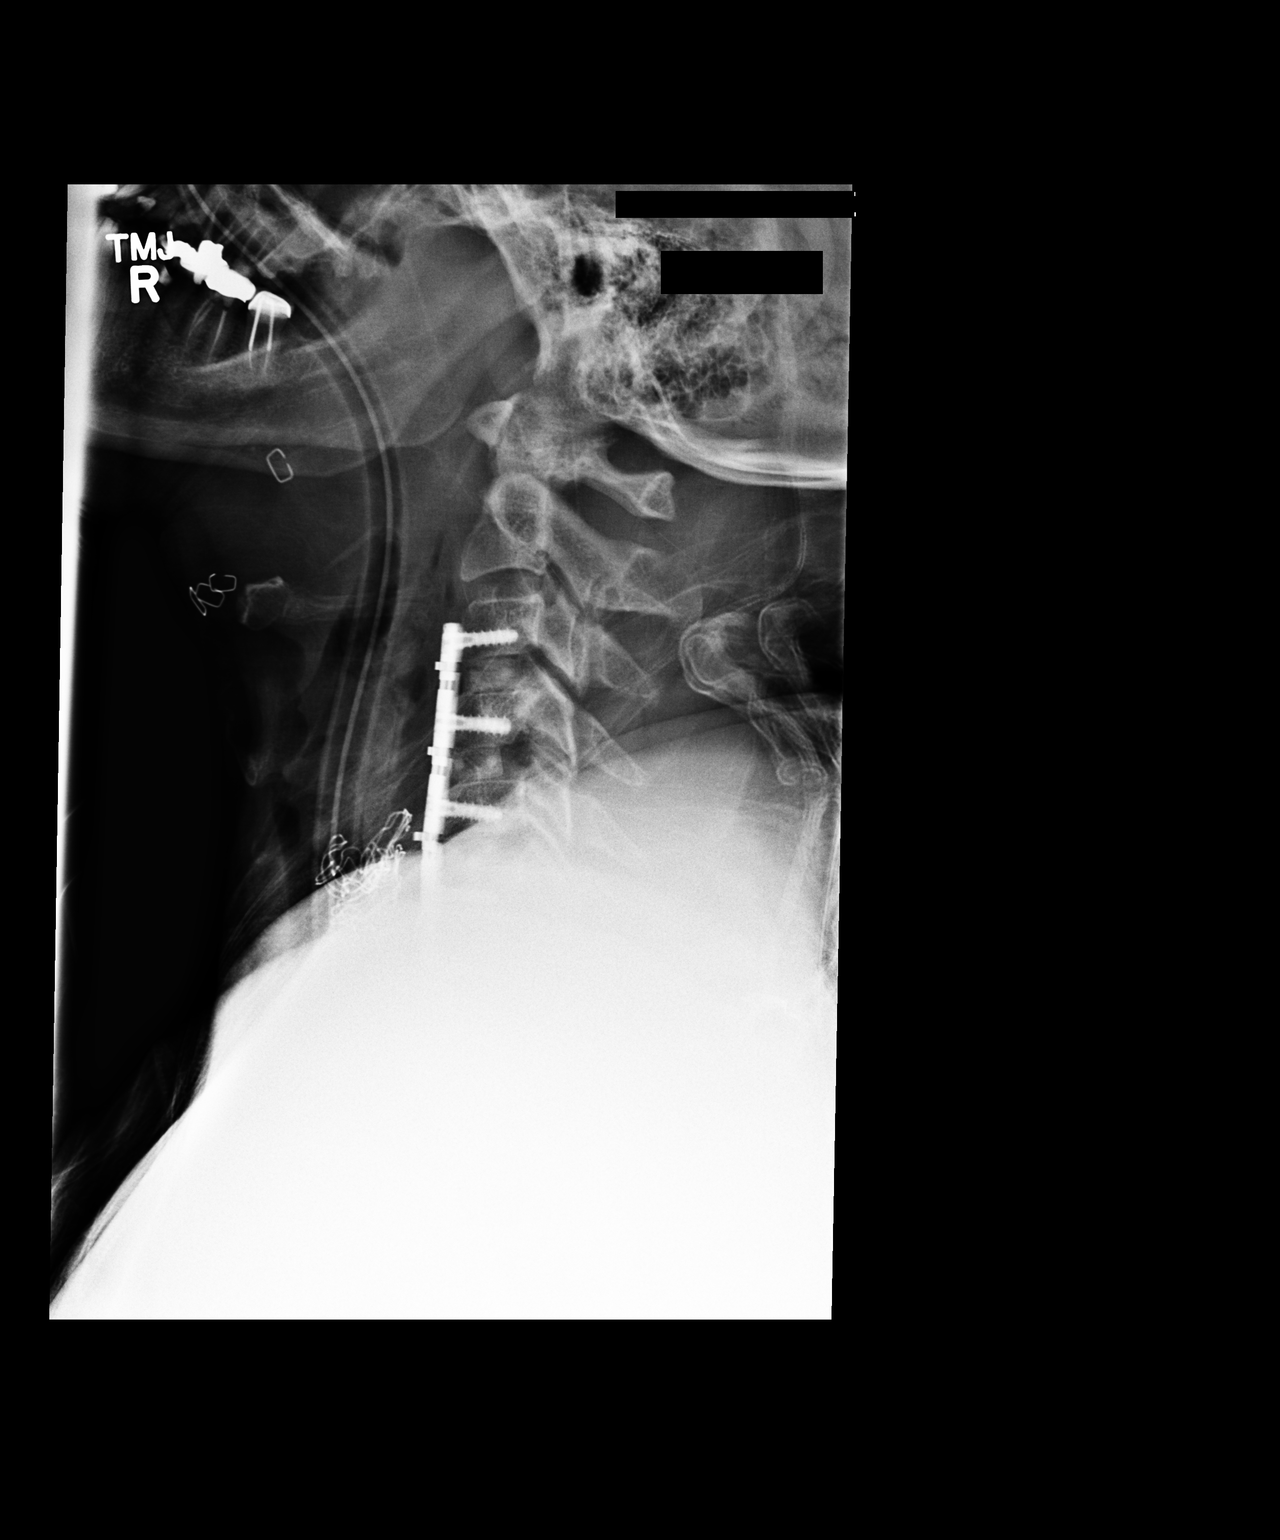

[1 of 1 positions shown; findings below may reference images not displayed]

FINDINGS: Initial lateral cervical spine image obtained shows surgical
instruments in the anterior aspect of the disc space at C3-4 and
C4-5. Subsequent completion film shows evidence of interbody fusion
at C3-4, C4-5 and C5-6 with anterior fixation. The inferior aspect
of the fixator plate and interbody fusion at C6-7 is not well
appreciated.
IMPRESSION: Anterior cervical fusion

## 2019-06-29 ENCOUNTER — Emergency Department (HOSPITAL_BASED_OUTPATIENT_CLINIC_OR_DEPARTMENT_OTHER): Payer: 59

## 2019-06-29 ENCOUNTER — Encounter (HOSPITAL_BASED_OUTPATIENT_CLINIC_OR_DEPARTMENT_OTHER): Payer: Self-pay | Admitting: Emergency Medicine

## 2019-06-29 ENCOUNTER — Other Ambulatory Visit: Payer: Self-pay

## 2019-06-29 ENCOUNTER — Emergency Department (HOSPITAL_BASED_OUTPATIENT_CLINIC_OR_DEPARTMENT_OTHER)
Admission: EM | Admit: 2019-06-29 | Discharge: 2019-06-29 | Disposition: A | Discharge status: Home / Self Care | Payer: 59 | Attending: Emergency Medicine | Admitting: Emergency Medicine

## 2019-06-29 DIAGNOSIS — R0789 Other chest pain: Secondary | ICD-10-CM | POA: Diagnosis present

## 2019-06-29 DIAGNOSIS — K219 Gastro-esophageal reflux disease without esophagitis: Secondary | ICD-10-CM | POA: Insufficient documentation

## 2019-06-29 DIAGNOSIS — Z87891 Personal history of nicotine dependence: Secondary | ICD-10-CM | POA: Insufficient documentation

## 2019-06-29 LAB — CBC WITH DIFFERENTIAL/PLATELET
Abs Immature Granulocytes: 0.03 10*3/uL (ref 0.00–0.07)
Basophils Absolute: 0.1 10*3/uL (ref 0.0–0.1)
Basophils Relative: 1 %
Eosinophils Absolute: 0.3 10*3/uL (ref 0.0–0.5)
Eosinophils Relative: 3 %
HCT: 48.4 % (ref 39.0–52.0)
Hemoglobin: 16.3 g/dL (ref 13.0–17.0)
Immature Granulocytes: 0 %
Lymphocytes Relative: 26 %
Lymphs Abs: 2.4 10*3/uL (ref 0.7–4.0)
MCH: 32.5 pg (ref 26.0–34.0)
MCHC: 33.7 g/dL (ref 30.0–36.0)
MCV: 96.4 fL (ref 80.0–100.0)
Monocytes Absolute: 1 10*3/uL (ref 0.1–1.0)
Monocytes Relative: 11 %
Neutro Abs: 5.3 10*3/uL (ref 1.7–7.7)
Neutrophils Relative %: 59 %
Platelets: 260 10*3/uL (ref 150–400)
RBC: 5.02 MIL/uL (ref 4.22–5.81)
RDW: 12 % (ref 11.5–15.5)
WBC: 9 10*3/uL (ref 4.0–10.5)
nRBC: 0 % (ref 0.0–0.2)

## 2019-06-29 LAB — COMPREHENSIVE METABOLIC PANEL
ALT: 43 U/L (ref 0–44)
AST: 22 U/L (ref 15–41)
Albumin: 4.2 g/dL (ref 3.5–5.0)
Alkaline Phosphatase: 68 U/L (ref 38–126)
Anion gap: 9 (ref 5–15)
BUN: 13 mg/dL (ref 6–20)
CO2: 24 mmol/L (ref 22–32)
Calcium: 9.4 mg/dL (ref 8.9–10.3)
Chloride: 104 mmol/L (ref 98–111)
Creatinine, Ser: 1.09 mg/dL (ref 0.61–1.24)
GFR calc Af Amer: >60 mL/min (ref >60)
GFR calc non Af Amer: >60 mL/min (ref >60)
Glucose, Bld: 120 mg/dL — ABNORMAL HIGH (ref 70–99)
Potassium: 3.5 mmol/L (ref 3.5–5.1)
Sodium: 137 mmol/L (ref 135–145)
Total Bilirubin: 0.5 mg/dL (ref 0.3–1.2)
Total Protein: 6.9 g/dL (ref 6.5–8.1)

## 2019-06-29 LAB — URINALYSIS, ROUTINE W REFLEX MICROSCOPIC
Bilirubin Urine: NEGATIVE
Glucose, UA: NEGATIVE mg/dL
Hgb urine dipstick: NEGATIVE
Ketones, ur: NEGATIVE mg/dL
Leukocytes,Ua: NEGATIVE
Nitrite: NEGATIVE
Protein, ur: NEGATIVE mg/dL
Specific Gravity, Urine: >1.03 — ABNORMAL HIGH (ref 1.005–1.030)
pH: 5.5 (ref 5.0–8.0)

## 2019-06-29 LAB — TROPONIN I (HIGH SENSITIVITY)
Troponin I (High Sensitivity): 3 ng/L (ref <18)
Troponin I (High Sensitivity): 3 ng/L (ref <18)

## 2019-06-29 MED ORDER — DICYCLOMINE HCL 10 MG/ML IM SOLN
20.0000 mg | Freq: Once | INTRAMUSCULAR | Status: AC
  Administered 2019-06-29: 20 mg via INTRAMUSCULAR
  Filled 2019-06-29: qty 2

## 2019-06-29 MED ORDER — KETOROLAC TROMETHAMINE 30 MG/ML IJ SOLN
15.0000 mg | Freq: Once | INTRAMUSCULAR | Status: AC
  Administered 2019-06-29: 15 mg via INTRAVENOUS
  Filled 2019-06-29: qty 1

## 2019-06-29 MED ORDER — SUCRALFATE 1 GM/10ML PO SUSP
1.0000 g | Freq: Three times a day (TID) | ORAL | Status: DC
  Administered 2019-06-29: 1 g via ORAL
  Filled 2019-06-29: qty 10

## 2019-06-29 MED ORDER — OMEPRAZOLE 40 MG PO CPDR: 40.0000 mg | DELAYED_RELEASE_CAPSULE | Freq: Every day | ORAL | 0 refills | Status: DC | PRN

## 2019-06-29 MED ORDER — FAMOTIDINE 20 MG PO TABS
20.0000 mg | ORAL_TABLET | Freq: Once | ORAL | Status: AC
  Administered 2019-06-29: 20 mg via ORAL
  Filled 2019-06-29: qty 1

## 2019-06-29 MED ORDER — LIDOCAINE VISCOUS HCL 2 % MT SOLN
15.0000 mL | Freq: Once | OROMUCOSAL | Status: AC
  Administered 2019-06-29: 15 mL via ORAL
  Filled 2019-06-29: qty 15

## 2019-06-29 MED ORDER — ACETAMINOPHEN 500 MG PO TABS: 1000.0000 mg | ORAL_TABLET | Freq: Once | ORAL | Status: DC

## 2019-06-29 MED ORDER — SUCRALFATE 1 GM/10ML PO SUSP: 1.0000 g | Freq: Three times a day (TID) | ORAL | 0 refills | Status: DC

## 2019-06-29 MED ORDER — ALUM & MAG HYDROXIDE-SIMETH 200-200-20 MG/5ML PO SUSP
30.0000 mL | Freq: Once | ORAL | Status: AC
Start: 2019-06-29 — End: 2019-06-29
  Administered 2019-06-29: 30 mL via ORAL
  Filled 2019-06-29: qty 30

## 2019-06-29 NOTE — ED Notes (Signed)
MD with pt  

## 2019-06-29 NOTE — ED Provider Notes (Signed)
[facility] DEPARTMENT Provider Note   CSN: [id number] Arrival date & time: 06/29/19  0234     History   Chief Complaint Chief Complaint  Patient presents with  . Chest Pain    HPI Brian Tran is a 43 y.o. male.     The history is provided by the patient.  Chest Pain Pain location:  Epigastric (LUQ) Pain quality comment:  "feels like indigestion" nightly x 3 nights Pain radiates to:  Does not radiate Pain severity:  Severe Onset quality:  Gradual Duration:  2 days Timing:  Intermittent Progression:  Worsening Chronicity:  Recurrent Context: eating and at rest   Context comment:  Ate a steak and cheese sub, then sausage biscuit then [employer] and then [employer] Relieved by:  Nothing Worsened by:  Certain positions Ineffective treatments:  None tried Associated symptoms: heartburn   Associated symptoms: no AICD problem, no altered mental status, no anorexia, no anxiety, no back pain, no claudication, no cough, no diaphoresis, no dizziness, no dysphagia, no fatigue, no fever, no headache, no lower extremity edema, no nausea, no near-syncope, no numbness, no orthopnea, no palpitations, no PND, no shortness of breath, no syncope, no vomiting and no weakness   Risk factors: male sex   Risk factors: no coronary artery disease, no diabetes mellitus, no high cholesterol, no hypertension, no Marfan's syndrome, no prior DVT/PE, no smoking and no surgery   Patient with known GERD presents with Nightly "indigestion and gas" in the LUQ and epigastric area.  Has been eating greasy foods as has been out a lot.  No travel.  No leg pain or swelling.  No DOE, no SOb, no n/v/d.  No covid exposure.  No cough, no anosmia, no diarrhea.    Past Medical History:  Diagnosis Date  . Anxiety   . DDD (degenerative disc disease), cervical   . GERD (gastroesophageal reflux disease)   . Neuromuscular disorder ([place])    right ulnar nerve pain  . Seasonal allergies     Patient Active Problem List   Diagnosis Date Noted  . Dehydration 03/08/2017  . Lumbar stenosis 03/05/2017  . HNP (herniated nucleus pulposus), cervical 10/25/2016    Past Surgical History:  Procedure Laterality Date  . ANTERIOR CERVICAL DECOMPRESSION/DISCECTOMY FUSION 4 LEVELS N/A 10/25/2016   Procedure: ANTERIOR CERVICAL DECOMPRESSION/DISCECTOMY FUSION  CERVICAL THREE-CERVICAL FOUR  CERVICAL FOUR-CERVICAL FIVE ,CERVICAL FIVE-CERVICAL SIX ,CERVICAL SIX-CERVICAL SEVEN;  Surgeon: Jovita Gamma, MD;  Location: [facility];  Service: Neurosurgery;  Laterality: N/A;  . BRONCHOSCOPY    . HERNIA REPAIR Right    inguinal hernia  . WISDOM TOOTH EXTRACTION          Home Medications    Prior to Admission medications   Medication Sig Start Date End Date Taking? Authorizing Provider  acetaminophen (TYLENOL) 500 MG tablet Take 1,000 mg by mouth every 8 (eight) hours as needed for mild pain or headache.     [provider]  cyclobenzaprine (FLEXERIL) 10 MG tablet Take 0.5-1 tablets (5-10 mg total) by mouth 3 (three) times daily as needed for muscle spasms. Patient not taking: Reported on 03/08/2017 03/06/17   Jovita Gamma, MD  HYDROcodone-acetaminophen [facility]) 7.5-325 MG tablet Take 1-2 tablets by mouth every 4 (four) hours as needed for severe pain (pain). Patient not taking: Reported on 10/03/2017 03/06/17   Jovita Gamma, MD  omeprazole (PRILOSEC) 40 MG capsule Take 40 mg by mouth daily as needed (for heartburn/acid reflux.).    [provider]  Family History Family History  Problem Relation Age of Onset  . Kidney cancer Father   . COPD Father   . Hypertension Father     Social History Social History   Tobacco Use  . Smoking status: Former Smoker    Quit date: 10/24/1996    Years since quitting: 22.6  . Smokeless tobacco: Current User    Types: Snuff  Substance Use Topics  . Alcohol use: Yes    Comment: occassional  . Drug use: No     Allergies    Tramadol   Review of Systems Review of Systems  Constitutional: Negative for diaphoresis, fatigue and fever.  HENT: Negative for trouble swallowing.   Eyes: Negative for visual disturbance.  Respiratory: Negative for cough and shortness of breath.   Cardiovascular: Positive for chest pain. Negative for palpitations, orthopnea, claudication, syncope, PND and near-syncope.  Gastrointestinal: Positive for heartburn. Negative for anorexia, nausea and vomiting.  Genitourinary: Negative for difficulty urinating.  Musculoskeletal: Negative for back pain.  Neurological: Negative for dizziness, weakness, numbness and headaches.  Psychiatric/Behavioral: Negative for agitation.  All other systems reviewed and are negative.    Physical Exam Updated Vital Signs Ht 6\' 3"  (1.905 m)   Wt 98.4 kg   BMI 27.12 kg/m   Physical Exam Vitals signs and nursing note reviewed.  Constitutional:      General: He is not in acute distress.    Appearance: He is not diaphoretic.  HENT:     Head: Normocephalic and atraumatic.     Nose: Nose normal.  Eyes:     Conjunctiva/sclera: Conjunctivae normal.     Pupils: Pupils are equal, round, and reactive to light.  Neck:     Musculoskeletal: Normal range of motion and neck supple.  Cardiovascular:     Rate and Rhythm: Normal rate and regular rhythm.     Pulses: Normal pulses.     Heart sounds: Normal heart sounds.  Pulmonary:     Effort: Pulmonary effort is normal.     Breath sounds: Normal breath sounds.  Abdominal:     General: Abdomen is flat. Bowel sounds are normal.     Tenderness: There is no abdominal tenderness. There is no guarding.  Musculoskeletal: Normal range of motion.  Skin:    General: Skin is warm and dry.     Capillary Refill: Capillary refill takes less than 2 seconds.  Neurological:     General: No focal deficit present.     Mental Status: He is alert and oriented to person, place, and time.     Deep Tendon Reflexes: Reflexes  normal.  Psychiatric:        Mood and Affect: Mood normal.        Behavior: Behavior normal.      ED Treatments / Results  Labs (all labs ordered are listed, but only abnormal results are displayed) Labs Reviewed  CBC WITH DIFFERENTIAL/PLATELET  COMPREHENSIVE METABOLIC PANEL  TROPONIN I (HIGH SENSITIVITY)    EKG  EKG Interpretation  Date/Time:  Sunday June 29 2019 02:42:26 EDT Ventricular Rate:  96 PR Interval:    QRS Duration: 101 QT Interval:  348 QTC Calculation: 440 R Axis:   57 Text Interpretation:  Sinus rhythm Baseline wander in lead(s) II III aVF V4 V5 Confirmed by Faithanne Verret ([id number]) on 06/29/2019 3:06:56 AM       Radiology No results found.  Procedures Procedures (including critical care time)  Medications Ordered in ED Medications  alum & mag hydroxide-simeth (MAALOX/MYLANTA)  200-200-20 MG/5ML suspension 30 mL (has no administration in time range)    And  lidocaine (XYLOCAINE) 2 % viscous mouth solution 15 mL (has no administration in time range)  sucralfate (CARAFATE) 1 GM/10ML suspension 1 g (has no administration in time range)  dicyclomine (BENTYL) injection 20 mg (has no administration in time range)  famotidine (PEPCID) tablet 20 mg (has no administration in time range)    Feeling better post medication.  Symptoms are consistent with GERD.  Have discussed medication and lifestyle/diet modification with patient and his wife. Medication sent to the patient's pharmacy.  PERC negative, wells 0 highly doubt PE in this very low risk patient.  Ruled out for MI in the Ed with negative EKG and 2 negative troponins. HEART score is 1 and is very low risk for MACE.    Brian Tran was evaluated in Emergency Department on 06/29/2019 for the symptoms described in the history of present illness. He was evaluated in the context of the global COVID-19 pandemic, which necessitated consideration that the patient might be at risk for infection with the  SARS-CoV-2 virus that causes COVID-19. Institutional protocols and algorithms that pertain to the evaluation of patients at risk for COVID-19 are in a state of rapid change based on information released by regulatory bodies including the CDC and federal and state organizations. These policies and algorithms were followed during the patient's care in the ED.   Final Clinical Impressions(s) / ED Diagnoses   Return for weakness, numbness, changes in vision or speech, fevers >100.4 unrelieved by medication, shortness of breath, intractable vomiting, or diarrhea, abdominal pain, Inability to tolerate liquids or food, cough, altered mental status or any concerns. No signs of systemic illness or infection. The patient is nontoxic-appearing on exam and vital signs are within normal limits.   I have reviewed the triage vital signs and the nursing notes. Pertinent labs &imaging results that were available during my care of the patient were reviewed by me and considered in my medical decision making (see chart for details).  After history, exam, and medical workup I feel the patient has been appropriately medically screened and is safe for discharge home. Pertinent diagnoses were discussed with the patient. Patient was given return precautions.   Aidenjames Heckmann, MD 06/29/19 [phone]

## 2019-06-29 NOTE — ED Triage Notes (Signed)
Pt c/o chest pain described as "indigestion" that started Friday, but worse this morning. Has not taken anything for the pain. C/o nausea. C/o feeling sob. Hx of reflux. Denies any long rides or plane trips. Denies any exposure to covid. Denies any diarrhea.

## 2019-07-22 ENCOUNTER — Encounter: Payer: Self-pay | Admitting: Gastroenterology

## 2019-08-28 ENCOUNTER — Other Ambulatory Visit: Payer: Self-pay

## 2019-08-28 ENCOUNTER — Ambulatory Visit: Payer: 59 | Attending: Neurosurgery

## 2019-08-28 DIAGNOSIS — G8929 Other chronic pain: Secondary | ICD-10-CM | POA: Diagnosis present

## 2019-08-28 DIAGNOSIS — M5441 Lumbago with sciatica, right side: Secondary | ICD-10-CM | POA: Diagnosis present

## 2019-08-28 DIAGNOSIS — M6281 Muscle weakness (generalized): Secondary | ICD-10-CM | POA: Insufficient documentation

## 2019-08-28 DIAGNOSIS — M62830 Muscle spasm of back: Secondary | ICD-10-CM | POA: Insufficient documentation

## 2019-08-28 NOTE — Therapy (Signed)
[facility] [address] [place], [place], [address] Phone: [phone]   Fax:  [phone]  Physical Therapy Evaluation  Patient Details  Name: Brian Tran MRN: [id number] Date of Birth: 1975-11-16 Referring Provider (PT): Jovita Gamma, MD   Encounter Date: 08/28/2019  PT End of Session - 08/28/19 1312    Visit Number  1    Date for PT Re-Evaluation  10/23/19    Authorization Type  UHC    PT Start Time  1233    PT Stop Time  1310    PT Time Calculation (min)  37 min    Activity Tolerance  Patient tolerated treatment well    Behavior During Therapy  [facility] for tasks assessed/performed       Past Medical History:  Diagnosis Date  . Anxiety   . DDD (degenerative disc disease), cervical   . GERD (gastroesophageal reflux disease)   . Neuromuscular disorder ([place])    right ulnar nerve pain  . Seasonal allergies     Past Surgical History:  Procedure Laterality Date  . ANTERIOR CERVICAL DECOMPRESSION/DISCECTOMY FUSION 4 LEVELS N/A 10/25/2016   Procedure: ANTERIOR CERVICAL DECOMPRESSION/DISCECTOMY FUSION  CERVICAL THREE-CERVICAL FOUR  CERVICAL FOUR-CERVICAL FIVE ,CERVICAL FIVE-CERVICAL SIX ,CERVICAL SIX-CERVICAL SEVEN;  Surgeon: Jovita Gamma, MD;  Location: [place];  Service: Neurosurgery;  Laterality: N/A;  . BRONCHOSCOPY    . HERNIA REPAIR Right    inguinal hernia  . WISDOM TOOTH EXTRACTION      There were no vitals filed for this visit.   Subjective Assessment - 08/28/19 1235    Subjective  Pt presents with bil lumbar pain of a chronic nature.  Pt has history of lumbar fusion in 2018 and LBP began ~8 months ago.    Pertinent History  cervical fusion 2018, L3-5 fusion 02/2017    Limitations  Sitting;Standing;Walking    How long can you sit comfortably?  not able to sit still.  30-45 minutes    Diagnostic tests  x-ray: healed fusion, cervical fusion not healing    Patient Stated Goals  reduce LBP,  sit/stand/walk longer without limitation    Currently in Pain?  Yes    Pain Score  5     Pain Location  Back    Pain Orientation  Left;Right;Lower    Pain Descriptors / Indicators  Aching;Numbness    Pain Type  Chronic pain    Pain Onset  More than a month ago    Pain Frequency  Constant    Aggravating Factors   sleep (waking 6x/night),  riding in the car, sitting    Pain Relieving Factors  heat, change of positions, TENs         [facility] PT Assessment - 08/28/19 0001      Assessment   Medical Diagnosis  chronic bilateral LBP without sciatica    Referring Provider (PT)  Jovita Gamma, MD    Onset Date/Surgical Date  12/27/18    Prior Therapy  at this clinic 2018      Precautions   Precautions  None      Restrictions   Weight Bearing Restrictions  No      Balance Screen   Has the patient fallen in the past 6 months  No    Has the patient had a decrease in activity level because of a fear of falling?   No    Is the patient reluctant to leave their home because of a fear of falling?  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, yardwork    Leisure  bike riding      Cognition   Overall Cognitive Status  Within Functional Limits for tasks assessed      Observation/Other Assessments   Focus on Therapeutic Outcomes (FOTO)   57% limitation      Posture/Postural Control   Posture/Postural Control  Postural limitations    Postural Limitations  Flexed trunk;Decreased lumbar lordosis      ROM / Strength   AROM / PROM / Strength  AROM;PROM;Strength      AROM   Overall AROM   Deficits    Overall AROM Comments  Lumbar A/ROM limited in all directions with reduced segmental movement due to multilevel lumbar fusion      PROM   Overall PROM   Deficits     Overall PROM Comments  hip flexors limited by 50% bilaterally, hip IR and flexion (SLR) limited by 25%      Strength   Overall Strength  Deficits    Overall Strength Comments  core strength 4/5.  Bil hips 4+/5, Lt knee 4+/5, Rt knee 5/5      Palpation   Palpation comment  diffuse palpable tenderness over bil lumbar paraspinals and quadratus      Transfers   Transfers  Stand to Sit;Sit to Stand    Sit to Stand  With upper extremity assist    Stand to Sit  With upper extremity assist      Ambulation/Gait   Ambulation/Gait  Yes    Gait Pattern  Within Functional Limits    Gait Comments  slow gait mobility                Objective measurements completed on examination: See above findings.              PT Education - 08/28/19 1309    Education Details  Access Code: [email or web address]    Person(s) Educated  Patient    Methods  Explanation;Demonstration;Handout    Comprehension  Returned demonstration;Verbalized understanding       PT Short Term Goals - 08/28/19 1316      PT SHORT TERM GOAL #1   Title  be independent in initial HEP    Time  4    Period  Weeks    Status  New    Target Date  09/25/19      PT SHORT TERM GOAL #2   Title  report < or = to 4/10 LBP with sitting for work and riding in the car    Time  4    Period  Weeks    Status  New    Target Date  09/25/19      PT SHORT TERM GOAL #3   Title  verbalize and demonstrate correct body mechanics modificaions for lumbar protection with lifting and household tasks    Time  4    Period  Weeks    Status  New    Target Date  09/25/19      PT SHORT TERM GOAL #4   Title  --        PT Long Term Goals - 08/28/19 1317  PT LONG TERM GOAL #1   Title  be independent in advanced HEP    Time  8    Period  Weeks    Status  New    Target Date  10/23/19      PT LONG TERM GOAL #2   Title  reduce FOTO to < or = to 41% limitation    Time  8    Period  Weeks    Status  New    Target Date  10/23/19       PT LONG TERM GOAL #3   Title  report a 60% reduction in LBP to allow for riding in the car for longer periods    Time  8    Period  Weeks    Status  New    Target Date  10/23/19      PT LONG TERM GOAL #4   Title  report waking < or = to 2x/night with LBP to improve quality and quantity of sleep    Time  8    Period  Weeks    Status  New    Target Date  10/23/19      PT LONG TERM GOAL #5   Title  --             Plan - 08/28/19 1323    Clinical Impression Statement  Pt presents to PT with chronic history of bilateral LBP that began ~8 months ago. Pt has history of L3-5 lumbar fusion in 2018 and denies any LE radiculopathy a this time.  Pt demonstrates significant hip stiffness in all directions, flexed trunk due to shortened hip flexors.  Pt with significant tension in bil lumbar paraspinals and palpable tenderness.  Pt reports 4-6/10 LBP with all transitional movement, is limited to riding in the car or driving > 20 minutes due to pain and is waking 6x/night with LBP.  Pt will benefit from skilled PT for LE flexibility, core strength, pain neuroscience education, and manual to address lumbar tissue mobility.    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  Evolving/Moderate complexity    Clinical Decision Making  Moderate    Rehab Potential  Good    PT Frequency  2x / week    PT Duration  8 weeks    PT Treatment/Interventions  ADLs/Self Care Home Management;Cryotherapy;Electrical Stimulation;Ultrasound;Moist Heat;Functional mobility training;Therapeutic activities;Therapeutic exercise;Neuromuscular re-education;Manual techniques;Patient/family education;Passive  range of motion;Dry needling;Joint Manipulations;Taping    PT Next Visit Plan  dry needling to lumbar and quadratus, flexibility of hips and lumbar spine, core strength, discuss more about meditation    PT Home Exercise Plan  Access Code: [email or web address]    Consulted and Agree with Plan of Care  Patient       Patient will benefit from skilled therapeutic intervention in order to improve the following deficits and impairments:  Decreased activity tolerance, Decreased balance, Decreased strength, Decreased mobility, Decreased endurance, Decreased range of motion, Postural dysfunction, Improper body mechanics, Impaired flexibility, Pain, Increased muscle spasms  Visit Diagnosis: Muscle weakness (generalized) - Plan: PT plan of care cert/re-cert  Chronic right-sided low back pain with right-sided sciatica - Plan: PT plan of care cert/re-cert  Muscle spasm of back - Plan: PT plan of care cert/re-cert     Problem  List Patient Active Problem List   Diagnosis Date Noted  . Dehydration 03/08/2017  . Lumbar stenosis 03/05/2017  . HNP (herniated nucleus pulposus), cervical 10/25/2016    Sigurd Sos, PT 08/28/19 1:27 PM  [facility] [address], [place], [place], [address] Phone: [phone]   Fax:  [phone]  Name: ORVILLE GRETH MRN: [id number] Date of Birth: 1976/04/23

## 2019-08-28 NOTE — Patient Instructions (Signed)
Access Code: [email or web address]  URL: [email or web address]  Date: 08/28/2019  Prepared by: Sigurd Sos   Exercises Supine Lower Trunk Rotation - 3 reps - 20 hold - 3x daily - 7x weekly Hooklying Single Knee to Chest - 3 reps - 20 hold - 3x daily - 7x weekly Modified Thomas Stretch - 3 reps - 3 sets - 20 hold - 3x daily - 7x weekly Seated Hamstring Stretch - 3 reps - 20 hold - 3x daily - 7x weekly

## 2019-09-01 ENCOUNTER — Other Ambulatory Visit: Payer: Self-pay

## 2019-09-01 ENCOUNTER — Ambulatory Visit: Payer: 59

## 2019-09-01 ENCOUNTER — Encounter: Payer: Self-pay | Admitting: Gastroenterology

## 2019-09-01 ENCOUNTER — Ambulatory Visit: Payer: 59 | Admitting: Gastroenterology

## 2019-09-01 VITALS — BP 142/90 | HR 85 | Temp 98.6°F | Ht 75.0 in | Wt 221.2 lb

## 2019-09-01 DIAGNOSIS — K760 Fatty (change of) liver, not elsewhere classified: Secondary | ICD-10-CM

## 2019-09-01 DIAGNOSIS — M5441 Lumbago with sciatica, right side: Secondary | ICD-10-CM

## 2019-09-01 DIAGNOSIS — M549 Dorsalgia, unspecified: Secondary | ICD-10-CM

## 2019-09-01 DIAGNOSIS — K219 Gastro-esophageal reflux disease without esophagitis: Secondary | ICD-10-CM | POA: Diagnosis not present

## 2019-09-01 DIAGNOSIS — M6281 Muscle weakness (generalized): Secondary | ICD-10-CM | POA: Diagnosis not present

## 2019-09-01 DIAGNOSIS — R1012 Left upper quadrant pain: Secondary | ICD-10-CM | POA: Diagnosis not present

## 2019-09-01 DIAGNOSIS — G8929 Other chronic pain: Secondary | ICD-10-CM

## 2019-09-01 DIAGNOSIS — N281 Cyst of kidney, acquired: Secondary | ICD-10-CM

## 2019-09-01 DIAGNOSIS — M62830 Muscle spasm of back: Secondary | ICD-10-CM

## 2019-09-01 DIAGNOSIS — Z1159 Encounter for screening for other viral diseases: Secondary | ICD-10-CM

## 2019-09-01 MED ORDER — PANTOPRAZOLE SODIUM 40 MG PO TBEC: 40.0000 mg | DELAYED_RELEASE_TABLET | Freq: Every day | ORAL | 3 refills | Status: DC

## 2019-09-01 NOTE — Progress Notes (Signed)
HPI :  43 year old male with a history of GERD, chronic back pain, multiple back surgeries, referred by Jena Gauss FNP for abdominal pain and GERD.  He states he has had symptoms in his upper abdomen, epigastric to left upper quadrant ongoing for the past 1 to 2 months.  He states the discomfort is there pretty much most of the time, rated mild at 1-2 out of 10.  It does not really ever go away.  No nausea or vomiting.  He does not endorse any relation to his eating habits, does not make it better or worse.  He does have intermittent bloating that bothers him as well as stools that go back and forth between looser and constipated stools.  He endorses a history of suspected IBS in the past.  He denies any weight loss.  In fact he thinks he is gained a little bit of weight.  He does have longstanding reflux for which she is been on a PPI for over 25 years.  He is mostly been on omeprazole anywhere from 20 mg to 40 mg a day.  The higher dose of omeprazole causes constipation, so he is trying to manage this at a lower dose to 20 mg a day.  He states his symptoms are very poorly controlled on this regimen he has frequent symptoms of reflux/pyrosis and regurgitation.  He denies any dysphagia, this can happen rarely.  He does have chronic back pain.  The area in his upper abdomen is tender to the touch and he thinks worse with sitting, may be slightly relieved with lying down.  He has been using Tylenol about 2500 mg a day for this.  He has used some ibuprofen for the past 1 to 2 months as well, he wonders if related to his symptoms.  He denies any family history of celiac disease.  He has been receiving physical therapy for his back, he wonders if this pain is musculoskeletal.  He had an ultrasound done at [facility] in November 23, this showed hepatic steatosis and a normal gallbladder.  His liver enzymes are normal.  He is not had any prior cross-sectional imaging I can see.  He endorses a very remote  history of an EGD early in the course of his reflux symptoms. He does not drink alcohol.    [place] 08/11/19 - [facility] - hepatic steatosis, normal gallbladder, 2cm left renal cyst  Labs  07/17/19 - A1c 5.4 06/29/19 - ALT 43, AST 22, AP 68, T bil 0.5, Hgb 16.3, WBC 9.0, plt 226     Past Medical History:  Diagnosis Date  . Anxiety   . DDD (degenerative disc disease), cervical   . GERD (gastroesophageal reflux disease)   . Neuromuscular disorder ([place])    right ulnar nerve pain  . Seasonal allergies      Past Surgical History:  Procedure Laterality Date  . ANTERIOR CERVICAL DECOMPRESSION/DISCECTOMY FUSION 4 LEVELS N/A 10/25/2016   Procedure: ANTERIOR CERVICAL DECOMPRESSION/DISCECTOMY FUSION  CERVICAL THREE-CERVICAL FOUR  CERVICAL FOUR-CERVICAL FIVE ,CERVICAL FIVE-CERVICAL SIX ,CERVICAL SIX-CERVICAL SEVEN;  Surgeon: Jovita Gamma, MD;  Location: [place];  Service: Neurosurgery;  Laterality: N/A;  . BRONCHOSCOPY    . HERNIA REPAIR Right    inguinal hernia  . WISDOM TOOTH EXTRACTION     Family History  Problem Relation Age of Onset  . Diabetes Mother   . Kidney cancer Father   . COPD Father   . Hypertension Father   . Heart Problems Father   .  Esophageal cancer Neg Hx   . Colon cancer Neg Hx   . Pancreatic cancer Neg Hx   . Stomach cancer Neg Hx    Social History   Tobacco Use  . Smoking status: Former Smoker    Quit date: 10/24/1996    Years since quitting: 22.8  . Smokeless tobacco: Current User    Types: Snuff  Substance Use Topics  . Alcohol use: Yes    Comment: occassional  . Drug use: No   Current Outpatient Medications  Medication Sig Dispense Refill  . acetaminophen (TYLENOL) 500 MG tablet Take 1,000 mg by mouth every 8 (eight) hours as needed for mild pain or headache.     Marland Kitchen atorvastatin (LIPITOR) 20 MG tablet Take 1 pill three nights/week (Mon-Wed-Fri)    . omeprazole (PRILOSEC) 20 MG capsule Take 20 mg by mouth daily.     No current facility-administered  medications for this visit.   Allergies  Allergen Reactions  . Tramadol Nausea And Vomiting     Review of Systems: All systems reviewed and negative except where noted in HPI.   Labs - see HPI above  Physical Exam: BP (!) 142/90 (BP Location: Left Arm, Patient Position: Sitting, Cuff Size: Large)   Pulse 85   Temp 98.6 F (37 C)   Ht 6\' 3"  (1.905 m)   Wt 221 lb 4 oz (100.4 kg)   BMI 27.65 kg/m  Constitutional: Pleasant,well-developed, male in no acute distress. HEENT: Normocephalic and atraumatic. Conjunctivae are normal. No scleral icterus. Neck supple.  Cardiovascular: Normal rate, regular rhythm.  Pulmonary/chest: Effort normal and breath sounds normal. No wheezing, rales or rhonchi. Abdominal: Soft, nondistended, tenderness to palpation in the epigastric / LUQ area along the costal margin but negative Carnett sign. There are no masses palpable. No hepatomegaly. Extremities: no edema Lymphadenopathy: No cervical adenopathy noted. Neurological: Alert and oriented to person place and time. Skin: Skin is warm and dry. No rashes noted. Psychiatric: Normal mood and affect. Behavior is normal.   ASSESSMENT AND PLAN: 43 year old male here for new patient assessment of the following:  Upper abdominal pain / GERD - he has had ongoing upper abdominal pain, more so localizes to the left upper quadrant, ongoing for the past 1 to 2 months.  History as above, we discussed differential.  It is a bit positional and tender to the touch, this could be musculoskeletal and related to his back pain, however his reflux is very poorly controlled on omeprazole at its current dose, esophagitis and PUD is on the differential, he has been using NSAIDs recently and I recommend he avoid them.  I discussed options with him.  I am recommending an upper endoscopy given his poorly controlled reflux, rule out esophagitis, peptic ulcer disease, H. pylori.  Further we discussed long-term management of his  reflux and he does not like taking medications at all and asked about alternatives.  Pending he does not have any significant hiatal hernia on EGD, he could be a candidate for endoscopic fundoplication (TIF) and I provided him handouts on that.  He is quite interested in TIF if he is a candidate in order to come off PPI.  I discussed EGD and anesthesia with him, risks and benefits, he wants to proceed.  We will await that result, in the interim I will switch him to Protonix 40 mg once a day to see if that provides any better control and does not cause the constipation omeprazole does.  I will also plan on  taking biopsies rule out celiac disease and H. pylori on this exam.  If this is negative and does not show any significant abnormalities, may consider cross-sectional imaging and further escalate therapy for musculoskeletal etiology.  Fatty liver - noted on ultrasound, he does not drink much alcohol.  We discussed fatty liver in general, risks for cirrhosis.  His liver enzymes are normal but have been elevated in the past.  Recommend he work on exercise, he is not overweight, will see if this will help and monitor over time.  He agreed  Boardman Cellar, MD [facility]  CC: Vicenta Aly, [place]

## 2019-09-01 NOTE — Patient Instructions (Signed)

## 2019-09-01 NOTE — Therapy (Addendum)
[facility] [address] [place], [place], [address] Phone: [phone]   Fax:  [phone]  Physical Therapy Treatment  Patient Details  Name: Brian Tran MRN: [id number] Date of Birth: Oct 13, 1975 Referring Provider (Brian Tran): Jovita Gamma, MD   Encounter Date: 09/01/2019  Brian Tran End of Session - 09/01/19 1226    Visit Number  2    Date for Brian Tran Re-Evaluation  10/23/19    Authorization Type  UHC    Brian Tran Start Time  1146   Dry needling   Brian Tran Stop Time  1225    Brian Tran Time Calculation (min)  39 min       Past Medical History:  Diagnosis Date  . Anxiety   . DDD (degenerative disc disease), cervical   . GERD (gastroesophageal reflux disease)   . Neuromuscular disorder ([place])    right ulnar nerve pain  . Seasonal allergies     Past Surgical History:  Procedure Laterality Date  . ANTERIOR CERVICAL DECOMPRESSION/DISCECTOMY FUSION 4 LEVELS N/A 10/25/2016   Procedure: ANTERIOR CERVICAL DECOMPRESSION/DISCECTOMY FUSION  CERVICAL THREE-CERVICAL FOUR  CERVICAL FOUR-CERVICAL FIVE ,CERVICAL FIVE-CERVICAL SIX ,CERVICAL SIX-CERVICAL SEVEN;  Surgeon: Jovita Gamma, MD;  Location: [place];  Service: Neurosurgery;  Laterality: N/A;  . BRONCHOSCOPY    . HERNIA REPAIR Right    inguinal hernia  . WISDOM TOOTH EXTRACTION      There were no vitals filed for this visit.  Subjective Assessment - 09/01/19 1144    Subjective  I have been doing my stretches and using TENs unit for pain.  I am feeling better.    Currently in Pain?  Yes    Pain Score  4     Pain Location  Back    Pain Orientation  Left;Right;Lower    Pain Descriptors / Indicators  Aching    Pain Type  Chronic pain    Pain Onset  More than a month ago    Pain Frequency  Constant    Aggravating Factors   sleep, riding in the car, sitting    Pain Relieving Factors  heat, change of position, TENs                       [facility] Adult Brian Tran Treatment/Exercise -  09/01/19 0001      Exercises   Exercises  Lumbar;Knee/Hip      Lumbar Exercises: Stretches   Active Hamstring Stretch  Right;Left;20 seconds;2 reps    Lower Trunk Rotation  3 reps;20 seconds    Hip Flexor Stretch  Right;Left;2 reps;20 seconds      Manual Therapy   Manual Therapy  Soft tissue mobilization;Myofascial release    Manual therapy comments  Rt>Lt thoracolumbar paraspinals and bil gluteals       Trigger Point Dry Needling - 09/01/19 0001    Consent Given?  Yes    Education Handout Provided  Yes    Muscles Treated Back/Hip  Gluteus minimus;Gluteus medius;Piriformis;Erector spinae    Gluteus Minimus Response  Twitch response elicited;Palpable increased muscle length    Gluteus Medius Response  Twitch response elicited;Palpable increased muscle length    Piriformis Response  Twitch response elicited;Palpable increased muscle length    Erector spinae Response  Twitch response elicited;Palpable increased muscle length    Lumbar multifidi Response  --           Brian Tran Education - 09/01/19 1155    Education Details  DN info    Person(s) Educated  Patient    Methods  Explanation;Handout    Comprehension  Verbalized understanding       Brian Tran Short Term Goals - 08/28/19 1316      Brian Tran SHORT TERM GOAL #1   Title  be independent in initial HEP    Time  4    Period  Weeks    Status  New    Target Date  09/25/19      Brian Tran SHORT TERM GOAL #2   Title  report < or = to 4/10 LBP with sitting for work and riding in the car    Time  4    Period  Weeks    Status  New    Target Date  09/25/19      Brian Tran SHORT TERM GOAL #3   Title  verbalize and demonstrate correct body mechanics modificaions for lumbar protection with lifting and household tasks    Time  4    Period  Weeks    Status  New    Target Date  09/25/19      Brian Tran SHORT TERM GOAL #4   Title  --        Brian Tran Long Term Goals - 08/28/19 1317      Brian Tran LONG TERM GOAL #1   Title  be independent in advanced HEP    Time  8     Period  Weeks    Status  New    Target Date  10/23/19      Brian Tran LONG TERM GOAL #2   Title  reduce FOTO to < or = to 41% limitation    Time  8    Period  Weeks    Status  New    Target Date  10/23/19      Brian Tran LONG TERM GOAL #3   Title  report a 60% reduction in LBP to allow for riding in the car for longer periods    Time  8    Period  Weeks    Status  New    Target Date  10/23/19      Brian Tran LONG TERM GOAL #4   Title  report waking < or = to 2x/night with LBP to improve quality and quantity of sleep    Time  8    Period  Weeks    Status  New    Target Date  10/23/19      Brian Tran LONG TERM GOAL #5   Title  --            Plan - 09/01/19 1159    Clinical Impression Statement  Brian Tran with first time follow-up after evaluation.  Session focused on review of HEP and dry needling to lumbar and gluteals.  Brian Tran with significant tension in bil hip musculature and trigger points in lumbar paraspinals.  Brian Tran with improved tissue mobility and reduced tension after dry needling and manual therapy today.  Brian Tran required minor verbal cues for stretching exercise to reduce substitution and understand the importance of gentle stretching.  Brian Tran will continue to benefit from skilled Brian Tran for flexibility, core strength and manual to address pain.    Brian Tran Frequency  2x / week    Brian Tran Duration  8 weeks    Brian Tran Treatment/Interventions  ADLs/Self Care Home Management;Cryotherapy;Electrical Stimulation;Ultrasound;Moist Heat;Functional mobility training;Therapeutic activities;Therapeutic exercise;Neuromuscular re-education;Manual techniques;Patient/family education;Passive range of motion;Dry needling;Joint Manipulations;Taping    Brian Tran Next Visit Plan  assess response to dry needling, teach hip flexor stretch in standing, begin core  activation.  DN to T10-L2 multifidi    Brian Tran Home Exercise Plan  Access Code: [email or web address]    Consulted and Agree with Plan of Care  Patient       Patient will benefit from skilled therapeutic intervention in  order to improve the following deficits and impairments:  Decreased activity tolerance, Decreased balance, Decreased strength, Decreased mobility, Decreased endurance, Decreased range of motion, Postural dysfunction, Improper body mechanics, Impaired flexibility, Pain, Increased muscle spasms  Visit Diagnosis: Muscle weakness (generalized)  Chronic right-sided low back pain with right-sided sciatica  Muscle spasm of back     Problem List Patient Active Problem List   Diagnosis Date Noted  . Dehydration 03/08/2017  . Lumbar stenosis 03/05/2017  . HNP (herniated nucleus pulposus), cervical 10/25/2016     Brian Tran, Brian Tran 09/01/19 12:36 PM  [facility] [address] [place], [place], [address] Phone: [phone]   Fax:  [phone]  Name: Brian Tran MRN: [id number] Date of Birth: 21-Sep-1975

## 2019-09-01 NOTE — Patient Instructions (Signed)
We have sent the following medications to your pharmacy for you to pick up at your convenience:  Pantoprazole.  Discontinue Omeprazole  You have been scheduled for an endoscopy. Please follow written instructions given to you at your visit today. If you use inhalers (even only as needed), please bring them with you on the day of your procedure.

## 2019-09-03 ENCOUNTER — Ambulatory Visit (INDEPENDENT_AMBULATORY_CARE_PROVIDER_SITE_OTHER): Payer: 59

## 2019-09-03 ENCOUNTER — Ambulatory Visit: Payer: 59

## 2019-09-03 ENCOUNTER — Other Ambulatory Visit: Payer: Self-pay

## 2019-09-03 DIAGNOSIS — Z1159 Encounter for screening for other viral diseases: Secondary | ICD-10-CM

## 2019-09-03 DIAGNOSIS — M62830 Muscle spasm of back: Secondary | ICD-10-CM

## 2019-09-03 DIAGNOSIS — G8929 Other chronic pain: Secondary | ICD-10-CM

## 2019-09-03 DIAGNOSIS — M5441 Lumbago with sciatica, right side: Secondary | ICD-10-CM

## 2019-09-03 DIAGNOSIS — M6281 Muscle weakness (generalized): Secondary | ICD-10-CM | POA: Diagnosis not present

## 2019-09-03 NOTE — Patient Instructions (Signed)
Access Code: [email or web address]  URL: [email or web address]  Date: 09/03/2019  Prepared by: Sigurd Sos   Exercises  Supine Transversus Abdominis Bracing - Hands on Stomach - 10 reps - 1 sets - 5 hold - 2x daily - 7x weekly Clamshell - 10 reps - 2 sets - 2x daily - 7x weekly Bent Knee Fallouts with Alternating Legs - 10 reps - 1 sets - 2x daily - 7x weekly

## 2019-09-03 NOTE — Therapy (Signed)
[facility] [address], [place], [place], [address] Phone: [phone]   Fax:  [phone]  Physical Therapy Treatment  Patient Details  Name: Brian Tran MRN: [id number] Date of Birth: Sep 22, 1975 Referring Provider (PT): Jovita Gamma, MD   Encounter Date: 09/03/2019  PT End of Session - 09/03/19 1521    Visit Number  3    Date for PT Re-Evaluation  10/23/19    Authorization Type  UHC    PT Start Time  [id number]    PT Stop Time  1524    PT Time Calculation (min)  38 min    Activity Tolerance  Patient tolerated treatment well    Behavior During Therapy  [facility] for tasks assessed/performed       Past Medical History:  Diagnosis Date  . Anxiety   . DDD (degenerative disc disease), cervical   . GERD (gastroesophageal reflux disease)   . Neuromuscular disorder ([place])    right ulnar nerve pain  . Seasonal allergies     Past Surgical History:  Procedure Laterality Date  . ANTERIOR CERVICAL DECOMPRESSION/DISCECTOMY FUSION 4 LEVELS N/A 10/25/2016   Procedure: ANTERIOR CERVICAL DECOMPRESSION/DISCECTOMY FUSION  CERVICAL THREE-CERVICAL FOUR  CERVICAL FOUR-CERVICAL FIVE ,CERVICAL FIVE-CERVICAL SIX ,CERVICAL SIX-CERVICAL SEVEN;  Surgeon: Jovita Gamma, MD;  Location: [facility];  Service: Neurosurgery;  Laterality: N/A;  . BRONCHOSCOPY    . HERNIA REPAIR Right    inguinal hernia  . WISDOM TOOTH EXTRACTION      There were no vitals filed for this visit.  Subjective Assessment - 09/03/19 1445    Subjective  I felt good after last session.  Last night was bad for sleep.  When it is cold and raining, I feel worse.    Currently in Pain?  Yes    Pain Score  6     Pain Location  Back    Pain Orientation  Right;Left;Lower    Pain Descriptors / Indicators  Aching    Pain Onset  More than a month ago    Pain Frequency  Constant                       [facility] Adult PT Treatment/Exercise - 09/03/19 0001      Lumbar Exercises: Stretches   Active Hamstring Stretch  Right;Left;20 seconds;2 reps    Single Knee to Chest Stretch  Left;Right;3 reps;20 seconds    Lower Trunk Rotation  3 reps;20 seconds    Hip Flexor Stretch  Right;Left;2 reps;20 seconds    Hip Flexor Stretch Limitations  standing using steps      Lumbar Exercises: Aerobic   Nustep  Level 2x 8 minutes       Lumbar Exercises: Supine   Ab Set  20 reps;5 seconds    Clam  20 reps    Bent Knee Raise  20 reps      Knee/Hip Exercises: Sidelying   Clams  2x10             PT Education - 09/03/19 1457    Education Details  Access Code: [email or web address]    Person(s) Educated  Patient    Methods  Explanation;Demonstration;Handout    Comprehension  Verbalized understanding;Returned demonstration       PT Short Term Goals - 08/28/19 1316      PT SHORT TERM GOAL #1   Title  be independent in initial HEP    Time  4    Period  Weeks  Status  New    Target Date  09/25/19      PT SHORT TERM GOAL #2   Title  report < or = to 4/10 LBP with sitting for work and riding in the car    Time  4    Period  Weeks    Status  New    Target Date  09/25/19      PT SHORT TERM GOAL #3   Title  verbalize and demonstrate correct body mechanics modificaions for lumbar protection with lifting and household tasks    Time  4    Period  Weeks    Status  New    Target Date  09/25/19      PT SHORT TERM GOAL #4   Title  --        PT Long Term Goals - 08/28/19 1317      PT LONG TERM GOAL #1   Title  be independent in advanced HEP    Time  8    Period  Weeks    Status  New    Target Date  10/23/19      PT LONG TERM GOAL #2   Title  reduce FOTO to < or = to 41% limitation    Time  8    Period  Weeks    Status  New    Target Date  10/23/19      PT LONG TERM GOAL #3   Title  report a 60% reduction in LBP to allow for riding in the car for longer periods    Time  8    Period  Weeks    Status  New    Target Date  10/23/19      PT LONG  TERM GOAL #4   Title  report waking < or = to 2x/night with LBP to improve quality and quantity of sleep    Time  8    Period  Weeks    Status  New    Target Date  10/23/19      PT LONG TERM GOAL #5   Title  --            Plan - 09/03/19 1506    Clinical Impression Statement  Pt responded well to dry needling last session.  Pt with increased pain today due to rainy weather.  PT emphasized importance of regular flexibility exercises due to chronic stiffness.  PT initiated core strength exercises for TA activation.  Pt required tactile and verbal cues for contraction of TA in supine.  Further review will be required to contract without accessory muscle activation.  Pt reported tingling in the Lt LE with sidelying clam exercise on the Lt.  This resolved after exercises was stopped.    Pt will continue to benefit from skilled PT for core strength, flexibility and manual therapy to address chronic lumbar pain.    PT Frequency  2x / week    PT Duration  8 weeks    PT Treatment/Interventions  ADLs/Self Care Home Management;Cryotherapy;Electrical Stimulation;Ultrasound;Moist Heat;Functional mobility training;Therapeutic activities;Therapeutic exercise;Neuromuscular re-education;Manual techniques;Patient/family education;Passive range of motion;Dry needling;Joint Manipulations;Taping    PT Next Visit Plan  Dry needling to lumbar, gluteals and T10-L2.  teach hip flexor stretch in standing, review core exercises    PT Home Exercise Plan  Access Code: [email or web address]    Recommended Other Services  initial certification is signed    Consulted and Agree with Plan of Care  Patient  Patient will benefit from skilled therapeutic intervention in order to improve the following deficits and impairments:  Decreased activity tolerance, Decreased balance, Decreased strength, Decreased mobility, Decreased endurance, Decreased range of motion, Postural dysfunction, Improper body mechanics, Impaired flexibility,  Pain, Increased muscle spasms  Visit Diagnosis: Muscle weakness (generalized)  Chronic right-sided low back pain with right-sided sciatica  Muscle spasm of back     Problem List Patient Active Problem List   Diagnosis Date Noted  . Dehydration 03/08/2017  . Lumbar stenosis 03/05/2017  . HNP (herniated nucleus pulposus), cervical 10/25/2016    Sigurd Sos, PT 09/03/19 3:25 PM  [facility] [address] [place], [place], [address] Phone: [phone]   Fax:  [phone]  Name: LAVARR DURNIN MRN: [id number] Date of Birth: Feb 16, 1976

## 2019-09-04 LAB — SARS CORONAVIRUS 2 (TAT 6-24 HRS): SARS Coronavirus 2: NEGATIVE

## 2019-09-05 ENCOUNTER — Other Ambulatory Visit: Payer: Self-pay

## 2019-09-05 ENCOUNTER — Ambulatory Visit (AMBULATORY_SURGERY_CENTER): Payer: 59 | Admitting: Gastroenterology

## 2019-09-05 ENCOUNTER — Encounter: Payer: Self-pay | Admitting: Gastroenterology

## 2019-09-05 VITALS — BP 119/69 | HR 67 | Temp 98.5°F | Resp 10 | Ht 75.0 in | Wt 221.0 lb

## 2019-09-05 DIAGNOSIS — K3189 Other diseases of stomach and duodenum: Secondary | ICD-10-CM

## 2019-09-05 DIAGNOSIS — K317 Polyp of stomach and duodenum: Secondary | ICD-10-CM | POA: Diagnosis present

## 2019-09-05 DIAGNOSIS — R1012 Left upper quadrant pain: Secondary | ICD-10-CM

## 2019-09-05 DIAGNOSIS — K219 Gastro-esophageal reflux disease without esophagitis: Secondary | ICD-10-CM | POA: Diagnosis not present

## 2019-09-05 DIAGNOSIS — K449 Diaphragmatic hernia without obstruction or gangrene: Secondary | ICD-10-CM | POA: Diagnosis not present

## 2019-09-05 MED ORDER — SODIUM CHLORIDE 0.9 % IV SOLN: 500.0000 mL | INTRAVENOUS | Status: DC

## 2019-09-05 NOTE — Progress Notes (Signed)
[place] checked, Temp JB, VS DT.  Pt's states no medical or surgical changes since office visit.

## 2019-09-05 NOTE — Patient Instructions (Signed)
Hiatal hernia and gastric polyp Please read all papers/handouts    YOU HAD AN ENDOSCOPIC PROCEDURE TODAY AT [facility]:   Refer to the procedure report that was given to you for any specific questions about what was found during the examination.  If the procedure report does not answer your questions, please call your gastroenterologist to clarify.  If you requested that your care partner not be given the details of your procedure findings, then the procedure report has been included in a sealed envelope for you to review at your convenience later.  YOU SHOULD EXPECT: Some feelings of bloating in the abdomen. Passage of more gas than usual.  Walking can help get rid of the air that was put into your GI tract during the procedure and reduce the bloating. If you had a lower endoscopy (such as a colonoscopy or flexible sigmoidoscopy) you may notice spotting of blood in your stool or on the toilet paper. If you underwent a bowel prep for your procedure, you may not have a normal bowel movement for a few days.  Please Note:  You might notice some irritation and congestion in your nose or some drainage.  This is from the oxygen used during your procedure.  There is no need for concern and it should clear up in a day or so.  SYMPTOMS TO REPORT IMMEDIATELY:    Following upper endoscopy (EGD)  Vomiting of blood or coffee ground material  New chest pain or pain under the shoulder blades  Painful or persistently difficult swallowing  New shortness of breath  Fever of 100F or higher  Black, tarry-looking stools  For urgent or emergent issues, a gastroenterologist can be reached at any hour by calling [phone].   DIET:  We do recommend a small meal at first, but then you may proceed to your regular diet.  Drink plenty of fluids but you should avoid alcoholic beverages for 24 hours.  ACTIVITY:  You should plan to take it easy for the rest of today and you should NOT DRIVE or  use heavy machinery until tomorrow (because of the sedation medicines used during the test).    FOLLOW UP: Our staff will call the number listed on your records 48-72 hours following your procedure to check on you and address any questions or concerns that you may have regarding the information given to you following your procedure. If we do not reach you, we will leave a message.  We will attempt to reach you two times.  During this call, we will ask if you have developed any symptoms of COVID 19. If you develop any symptoms (ie: fever, flu-like symptoms, shortness of breath, cough etc.) before then, please call [phone].  If you test positive for Covid 19 in the 2 weeks post procedure, please call and report this information to [place].    If any biopsies were taken you will be contacted by phone or by letter within the next 1-3 weeks.  Please call us at [phone] if you have not heard about the biopsies in 3 weeks.    SIGNATURES/CONFIDENTIALITY: You and/or your care partner have signed paperwork which will be entered into your electronic medical record.  These signatures attest to the fact that that the information above on your After Visit Summary has been reviewed and is understood.  Full responsibility of the confidentiality of this discharge information lies with you and/or your care-partner.

## 2019-09-05 NOTE — Progress Notes (Signed)
Called to room to assist during endoscopic procedure.  Patient ID and intended procedure confirmed with present staff. Received instructions for my participation in the procedure from the performing physician.  

## 2019-09-05 NOTE — Progress Notes (Signed)
To PACU, VSS. Report to [email or web address] 

## 2019-09-05 NOTE — Op Note (Signed)
[facility] Patient Name: Brian Tran Procedure Date: 09/05/2019 9:02 AM MRN: [id number] Endoscopist: Remo Lipps P. Havery Moros , MD Age: 43 Referring MD:  Date of Birth: 09-10-1976 Gender: Male Account #: [id number] Procedure:                Upper GI endoscopy Indications:              Upper abdominal pain, Follow-up of                            gastro-esophageal reflux disease - chronic reflux                            symptoms, on chronic PPI, looking to get off                            medications for reflux, consideration for TIF Medicines:                Monitored Anesthesia Care Procedure:                Pre-Anesthesia Assessment:                           - Prior to the procedure, a History and Physical                            was performed, and patient medications and                            allergies were reviewed. The patient's tolerance of                            previous anesthesia was also reviewed. The risks                            and benefits of the procedure and the sedation                            options and risks were discussed with the patient.                            All questions were answered, and informed consent                            was obtained. Prior Anticoagulants: The patient has                            taken no previous anticoagulant or antiplatelet                            agents. ASA Grade Assessment: II - A patient with                            mild systemic disease. After reviewing the risks  and benefits, the patient was deemed in                            satisfactory condition to undergo the procedure.                           After obtaining informed consent, the endoscope was                            passed under direct vision. Throughout the                            procedure, the patient's blood pressure, pulse, and                            oxygen saturations were  monitored continuously. The                            Endoscope was introduced through the mouth, and                            advanced to the second part of duodenum. The upper                            GI endoscopy was accomplished without difficulty.                            The patient tolerated the procedure well. Scope In: Scope Out: Findings:                 Esophagogastric landmarks were identified: the                            Z-line was found at 38 cm, the gastroesophageal                            junction was found at 38 cm and the upper extent of                            the gastric folds was found at 39 cm from the                            incisors. The z-line was slightly irregular but did                            not meet criteria for Barrett's esophagus.                           A 1 cm hiatal hernia was present.                           The exam of the esophagus was otherwise normal.  A few small sessile polyps were found in the                            gastric fundus and in the gastric body. One                            representative polyp was removed with a cold biopsy                            forceps. Resection and retrieval were complete.                           The exam of the stomach was otherwise normal. Hill                            grade I views of the cardia appreciated.                           The duodenal bulb and second portion of the                            duodenum were normal. Biopsies for histology were                            taken with a cold forceps for evaluation of celiac                            disease.                           Biopsies were taken with a cold forceps in the                            gastric body, at the incisura and in the gastric                            antrum for Helicobacter pylori testing. Complications:            No immediate complications. Estimated blood  loss:                            Minimal. Estimated Blood Loss:     Estimated blood loss was minimal. Impression:               - Esophagogastric landmarks identified.                           - 1 cm hiatal hernia.                           - Normal esophagus otherwise                           - A few gastric polyps. Suspect benign fundic gland  polyps. One representative polyp resected and                            retrieved.                           - Normal stomach otherwise. Hill grade I view of                            the cardia. Biopsies taken to rule out H Pylori.                           - Normal duodenal bulb and second portion of the                            duodenum. Biopsied.                           No obvious cause for pain noted on EGD, will see                            what biopsies show. Pain could otherwise be                            musculoskeletal in etiology. Patient is a candidate                            for TIF if interested in the future. Recommendation:           - Patient has a contact number available for                            emergencies. The signs and symptoms of potential                            delayed complications were discussed with the                            patient. Return to normal activities tomorrow.                            Written discharge instructions were provided to the                            patient.                           - Resume previous diet.                           - Continue present medications.                           - Await pathology results. Remo Lipps P. Armbruster, MD 09/05/2019 9:24:39 AM This report has been signed electronically.

## 2019-09-08 ENCOUNTER — Other Ambulatory Visit: Payer: Self-pay

## 2019-09-08 ENCOUNTER — Ambulatory Visit: Payer: 59

## 2019-09-08 DIAGNOSIS — M5441 Lumbago with sciatica, right side: Secondary | ICD-10-CM

## 2019-09-08 DIAGNOSIS — G8929 Other chronic pain: Secondary | ICD-10-CM

## 2019-09-08 DIAGNOSIS — M6281 Muscle weakness (generalized): Secondary | ICD-10-CM | POA: Diagnosis not present

## 2019-09-08 DIAGNOSIS — M62830 Muscle spasm of back: Secondary | ICD-10-CM

## 2019-09-08 NOTE — Therapy (Signed)
[facility] [address], [place], [place], [address] Phone: [phone]   Fax:  [phone]  Physical Therapy Treatment  Patient Details  Name: Brian Tran MRN: [id number] Date of Birth: 04/06/1976 Referring Provider (PT): Jovita Gamma, MD   Encounter Date: 09/08/2019  PT End of Session - 09/08/19 1225    Visit Number  4    Date for PT Re-Evaluation  10/23/19    Authorization Type  UHC    PT Start Time  1146    PT Stop Time  1225   dry needling   PT Time Calculation (min)  39 min    Activity Tolerance  Patient tolerated treatment well    Behavior During Therapy  [facility] for tasks assessed/performed       Past Medical History:  Diagnosis Date  . Anxiety   . DDD (degenerative disc disease), cervical   . Fatty liver   . GERD (gastroesophageal reflux disease)   . Hyperlipidemia   . Neuromuscular disorder ([place])    right ulnar nerve pain  . Seasonal allergies     Past Surgical History:  Procedure Laterality Date  . ANTERIOR CERVICAL DECOMPRESSION/DISCECTOMY FUSION 4 LEVELS N/A 10/25/2016   Procedure: ANTERIOR CERVICAL DECOMPRESSION/DISCECTOMY FUSION  CERVICAL THREE-CERVICAL FOUR  CERVICAL FOUR-CERVICAL FIVE ,CERVICAL FIVE-CERVICAL SIX ,CERVICAL SIX-CERVICAL SEVEN;  Surgeon: Jovita Gamma, MD;  Location: [facility];  Service: Neurosurgery;  Laterality: N/A;  . BRONCHOSCOPY    . HERNIA REPAIR Right    inguinal hernia  . WISDOM TOOTH EXTRACTION      There were no vitals filed for this visit.  Subjective Assessment - 09/08/19 1149    Subjective  I am having more pain because it is so cold.  I tried an upright  stationary bike and it felt good- I will buy soon    Currently in Pain?  Yes    Pain Score  5     Pain Location  Back    Pain Orientation  Right;Left    Pain Descriptors / Indicators  Constant    Pain Type  Chronic pain    Pain Onset  More than a month ago    Pain Frequency  Constant    Aggravating  Factors   riding in the car, sitting, sleep    Pain Relieving Factors  heat, TENs                       [facility] Adult PT Treatment/Exercise - 09/08/19 0001      Lumbar Exercises: Stretches   Active Hamstring Stretch  Right;Left;20 seconds;2 reps    Single Knee to Chest Stretch  Left;Right;3 reps;20 seconds      Lumbar Exercises: Aerobic   Nustep  Level 2x 8 minutes       Lumbar Exercises: Supine   Ab Set  20 reps;5 seconds    Clam  20 reps    Bent Knee Raise  20 reps      Knee/Hip Exercises: Sidelying   Clams  2x10      Manual Therapy   Manual Therapy  Soft tissue mobilization;Myofascial release    Manual therapy comments  Bil thoracolumbar paraspinals and bil gluteals       Trigger Point Dry Needling - 09/08/19 0001    Consent Given?  Yes    Muscles Treated Back/Hip  Gluteus minimus;Gluteus medius;Piriformis;Erector spinae    Gluteus Minimus Response  Twitch response elicited;Palpable increased muscle length    Gluteus  Medius Response  Twitch response elicited;Palpable increased muscle length    Piriformis Response  Twitch response elicited;Palpable increased muscle length    Erector spinae Response  Twitch response elicited;Palpable increased muscle length             PT Short Term Goals - 09/08/19 1154      PT SHORT TERM GOAL #1   Title  be independent in initial HEP    Baseline  moderate consistency    Time  4    Period  Weeks    Status  On-going        PT Long Term Goals - 08/28/19 1317      PT LONG TERM GOAL #1   Title  be independent in advanced HEP    Time  8    Period  Weeks    Status  New    Target Date  10/23/19      PT LONG TERM GOAL #2   Title  reduce FOTO to < or = to 41% limitation    Time  8    Period  Weeks    Status  New    Target Date  10/23/19      PT LONG TERM GOAL #3   Title  report a 60% reduction in LBP to allow for riding in the car for longer periods    Time  8    Period  Weeks    Status  New    Target  Date  10/23/19      PT LONG TERM GOAL #4   Title  report waking < or = to 2x/night with LBP to improve quality and quantity of sleep    Time  8    Period  Weeks    Status  New    Target Date  10/23/19      PT LONG TERM GOAL #5   Title  --            Plan - 09/08/19 1203    Clinical Impression Statement  Pt is working on consistency with HEP.  Pt continues to report 5/10 lumbar pain with daily activity.  Pt with chronic core and hip weakness and flexibility deficits and PT encouraged pt to be consistent with HEP.  Pt required moderate verbal cues for technique with core activation and leg motion.  Pt with some Lt LE radiculopathy with activation of Lt gluteals.  Pt with tension in and trigger points in gluteals and lumbar paraspinals bilaterally.  Pt demonstrated improved tissue mobility after dry needling and manual therapy today.  Pt will continue to benefit from skilled PT for strength, flexibility and manual to address lumbar pain and Lt LE radiculopathy.    PT Frequency  2x / week    PT Duration  8 weeks    PT Treatment/Interventions  ADLs/Self Care Home Management;Cryotherapy;Electrical Stimulation;Ultrasound;Moist Heat;Functional mobility training;Therapeutic activities;Therapeutic exercise;Neuromuscular re-education;Manual techniques;Patient/family education;Passive range of motion;Dry needling;Joint Manipulations;Taping    PT Next Visit Plan  Assess response to dry needling,   teach hip flexor stretch in standing, review core exercises again       Patient will benefit from skilled therapeutic intervention in order to improve the following deficits and impairments:  Decreased activity tolerance, Decreased balance, Decreased strength, Decreased mobility, Decreased endurance, Decreased range of motion, Postural dysfunction, Improper body mechanics, Impaired flexibility, Pain, Increased muscle spasms  Visit Diagnosis: Chronic right-sided low back pain with right-sided  sciatica  Muscle weakness (generalized)  Muscle spasm of back  Problem List Patient Active Problem List   Diagnosis Date Noted  . Dehydration 03/08/2017  . Lumbar stenosis 03/05/2017  . HNP (herniated nucleus pulposus), cervical 10/25/2016     Sigurd Sos, PT 09/08/19 12:26 PM   [facility] [address], [place], [place], [address] Phone: [phone]   Fax:  [phone]  Name: Brian Tran MRN: [id number] Date of Birth: 1975/10/31

## 2019-09-09 ENCOUNTER — Telehealth: Payer: Self-pay | Admitting: *Deleted

## 2019-09-09 NOTE — Telephone Encounter (Signed)
No answer for post procedure call back. Left message for patient to call back with questions or concerns.

## 2019-09-09 NOTE — Telephone Encounter (Signed)
Follow up call made, left message. 

## 2019-09-10 ENCOUNTER — Ambulatory Visit: Payer: 59

## 2019-09-10 ENCOUNTER — Telehealth: Payer: Self-pay

## 2019-09-10 NOTE — Telephone Encounter (Signed)
Left message for patient to please call back. 

## 2019-09-15 ENCOUNTER — Other Ambulatory Visit: Payer: Self-pay

## 2019-09-15 ENCOUNTER — Telehealth: Payer: Self-pay

## 2019-09-15 DIAGNOSIS — R1012 Left upper quadrant pain: Secondary | ICD-10-CM

## 2019-09-15 DIAGNOSIS — K219 Gastro-esophageal reflux disease without esophagitis: Secondary | ICD-10-CM

## 2019-09-15 NOTE — Telephone Encounter (Signed)
See result note.  

## 2019-09-15 NOTE — Telephone Encounter (Signed)
-----   Message from Yetta Flock, MD sent at 09/15/2019 12:55 PM EST ----- CT scan is to evaluate his pain. We have already evaluated his esophagus / hiatal hernia, it won't give any further information about that, only his pain.  Barium swallow is to evaluate his esophagus and confirm reflux on this exam, prior to proceeding with TIF. If it shows clear reflux, we can proceed with the TIF. If is is normal, and shows no reflux, he may need a pH study prior to proceeding. Thanks  He should continue his PPI, would not stop it unless he proceeds with TIF at some point. ----- Message ----- From: Hughie Closs, RN Sent: 09/15/2019  10:12 AM EST To: Yetta Flock, MD  Dr. Havery Moros patient is  willing to have CT and Barium Swallow, but he wants to know if the CT will give more information about his hiatal hernia. Also are you wanting him to stop his PPI now ? Thanks  Sherlynn Stalls ----- Message ----- From: Yetta Flock, MD Sent: 09/10/2019  12:58 PM EST To: Hughie Closs, RN  Kashina Mecum can you help relay the following:The patient had an EGD for abdominal pain or reflux.  Biopsies of a small intestine, stomach, and stomach polyps are benign, normal, no concerning findings.  I do not see a clear cause for his pain on this exam, this could be musculoskeletal in etiology.  If it is persisting however and continues to bother him, the next step would be a CT scan of the abdomen with contrast to ensure nothing else is going on.  If he is in a lot of pain can you please assist in scheduling that.Otherwise I do think he is a candidate for endoscopic TIF procedure if he wants to come off PPI to treat his reflux.  In order to further assess his candidacy for this we will need to proceed with a barium swallow if he is interested in doing that.  If that study looks okay then I will refer him to Dr. Bryan Lemma for TIF.  Thanks a lot

## 2019-09-15 NOTE — Telephone Encounter (Signed)
Called patient and gave Dr. Doyne Keel explanation of the reasons for the  CT and barium swallow study. Scheduled CT-abd. w/contrast at [facility] on 09/25/19 to arrive at 9:45am and be NPO 4 hours before except for the contrast. To pick-up the 2 bottles at [facility] before 09/25/19 and drink the 1st bottle at 8:00am and the 2nd bottle at 9:00am. Also scheduled Barium swallow on 09/23/19 to arrive at 10:15am and be NPO 3 hours before.

## 2019-09-17 ENCOUNTER — Other Ambulatory Visit: Payer: Self-pay

## 2019-09-17 ENCOUNTER — Ambulatory Visit: Payer: 59

## 2019-09-17 DIAGNOSIS — M6281 Muscle weakness (generalized): Secondary | ICD-10-CM

## 2019-09-17 DIAGNOSIS — G8929 Other chronic pain: Secondary | ICD-10-CM

## 2019-09-17 DIAGNOSIS — M62830 Muscle spasm of back: Secondary | ICD-10-CM

## 2019-09-17 NOTE — Therapy (Addendum)
[facility] [address] [place], [place], [address] Phone: [phone]   Fax:  [phone]  Physical Therapy Treatment  Patient Details  Name: Brian Tran MRN: [id number] Date of Birth: Dec 06, 1975 Referring Provider (PT): Jovita Gamma, MD   Encounter Date: 09/17/2019  PT End of Session - 09/17/19 0913    Visit Number  4    Date for PT Re-Evaluation  10/23/19    Authorization Type  UHC    PT Start Time  [id number]    PT Stop Time  0925    PT Time Calculation (min)  38 min    Activity Tolerance  Patient limited by pain    Behavior During Therapy  [facility] for tasks assessed/performed       Past Medical History:  Diagnosis Date  . Anxiety   . DDD (degenerative disc disease), cervical   . Fatty liver   . GERD (gastroesophageal reflux disease)   . Hyperlipidemia   . Neuromuscular disorder ([place])    right ulnar nerve pain  . Seasonal allergies     Past Surgical History:  Procedure Laterality Date  . ANTERIOR CERVICAL DECOMPRESSION/DISCECTOMY FUSION 4 LEVELS N/A 10/25/2016   Procedure: ANTERIOR CERVICAL DECOMPRESSION/DISCECTOMY FUSION  CERVICAL THREE-CERVICAL FOUR  CERVICAL FOUR-CERVICAL FIVE ,CERVICAL FIVE-CERVICAL SIX ,CERVICAL SIX-CERVICAL SEVEN;  Surgeon: Jovita Gamma, MD;  Location: [place];  Service: Neurosurgery;  Laterality: N/A;  . BRONCHOSCOPY    . HERNIA REPAIR Right    inguinal hernia  . WISDOM TOOTH EXTRACTION      There were no vitals filed for this visit.  Subjective Assessment - 09/17/19 0848    Subjective  I am about the same.  I didn't do my exercises as much as I should have over Christmas.    Pertinent History  cervical fusion 2018, L3-5 fusion 02/2017    Patient Stated Goals  reduce LBP, sit/stand/walk longer without limitation    Currently in Pain?  Yes    Pain Score  4     Pain Location  Back    Pain Orientation  Right;Left    Pain Descriptors / Indicators  Constant    Pain Onset   More than a month ago    Pain Frequency  Constant    Aggravating Factors   riding in the car, sitting, sleep    Pain Relieving Factors  heat, TENs                       [facility] Adult PT Treatment/Exercise - 09/17/19 0001      Lumbar Exercises: Stretches   Other Lumbar Stretch Exercise  nerve flossing: seated- pt didn't tolerate this postion.  Supine flossing       Lumbar Exercises: Prone   Other Prone Lumbar Exercises  prone x 3 minutes to resolve Lt LE radiculopathy      Modalities   Modalities  Traction      Traction   Type of Traction  Lumbar    Min (lbs)  50    Max (lbs)  110    Hold Time  60    Rest Time  10    Time  15               PT Short Term Goals - 09/08/19 1154      PT SHORT TERM GOAL #1   Title  be independent in initial HEP    Baseline  moderate consistency    Time  4    Period  Weeks    Status  On-going        PT Long Term Goals - 08/28/19 1317      PT LONG TERM GOAL #1   Title  be independent in advanced HEP    Time  8    Period  Weeks    Status  New    Target Date  10/23/19      PT LONG TERM GOAL #2   Title  reduce FOTO to < or = to 41% limitation    Time  8    Period  Weeks    Status  New    Target Date  10/23/19      PT LONG TERM GOAL #3   Title  report a 60% reduction in LBP to allow for riding in the car for longer periods    Time  8    Period  Weeks    Status  New    Target Date  10/23/19      PT LONG TERM GOAL #4   Title  report waking < or = to 2x/night with LBP to improve quality and quantity of sleep    Time  8    Period  Weeks    Status  New    Target Date  10/23/19      PT LONG TERM GOAL #5   Title  --            Plan - 09/17/19 0914    Clinical Impression Statement  Pt with continued Lt LE radiculopathy.  Trial of nerve glides/flossing today.  Pt was not able to tolerate this in sitting.  Pt with pain with supine position flossing with tingling in his feet.  No resolution of symptoms  with prone position.  Trial of traction today and pt reported initial resolution of Lt LE radiculopathy followed by bilateral tingling and numbness in bil legs with increased time. PT advised pt to contact MD regarding persistent symptoms.  Pt will continue to perform flexibility and core strength exercises at home and avoid aggravating positions.  Pt will continue to benefit from skilled PT to address Lt LE radiculopathy, weakness and limited flexibility.    Rehab Potential  Good    PT Treatment/Interventions  ADLs/Self Care Home Management;Cryotherapy;Electrical Stimulation;Ultrasound;Moist Heat;Functional mobility training;Therapeutic activities;Therapeutic exercise;Neuromuscular re-education;Manual techniques;Patient/family education;Passive range of motion;Dry needling;Joint Manipulations;Taping    PT Next Visit Plan  see if pt contacted MD, review core exercises again.  Neutral spine exercise.    PT Home Exercise Plan  Access Code: [email or web address]    Consulted and Agree with Plan of Care  Patient       Patient will benefit from skilled therapeutic intervention in order to improve the following deficits and impairments:  Decreased activity tolerance, Decreased balance, Decreased strength, Decreased mobility, Decreased endurance, Decreased range of motion, Postural dysfunction, Improper body mechanics, Impaired flexibility, Pain, Increased muscle spasms  Visit Diagnosis: Chronic right-sided low back pain with right-sided sciatica  Muscle weakness (generalized)  Muscle spasm of back     Problem List Patient Active Problem List   Diagnosis Date Noted  . Dehydration 03/08/2017  . Lumbar stenosis 03/05/2017  . HNP (herniated nucleus pulposus), cervical 10/25/2016     Sigurd Sos, PT 09/17/19 9:29 AM  [facility] [address], [place], [place], [address] Phone: [phone]   Fax:  [phone]  Name: Brian Tran MRN:  [id number] Date of Birth:  03/28/1976   

## 2019-09-18 ENCOUNTER — Encounter: Payer: Self-pay | Admitting: Physical Therapy

## 2019-09-18 ENCOUNTER — Ambulatory Visit: Payer: 59 | Admitting: Physical Therapy

## 2019-09-18 ENCOUNTER — Other Ambulatory Visit: Payer: Self-pay

## 2019-09-18 DIAGNOSIS — M6281 Muscle weakness (generalized): Secondary | ICD-10-CM

## 2019-09-18 DIAGNOSIS — M62830 Muscle spasm of back: Secondary | ICD-10-CM

## 2019-09-18 DIAGNOSIS — G8929 Other chronic pain: Secondary | ICD-10-CM

## 2019-09-18 DIAGNOSIS — M5441 Lumbago with sciatica, right side: Secondary | ICD-10-CM

## 2019-09-18 NOTE — Therapy (Signed)
[facility] [address] [place], [place], [address] Phone: [phone]   Fax:  [phone]  Physical Therapy Treatment  Patient Details  Name: ARMONIE CRISSINGER MRN: [id number] Date of Birth: 09/24/1975 Referring Provider (PT): Jovita Gamma, MD   Encounter Date: 09/18/2019  PT End of Session - 09/18/19 0926    Visit Number  6    Date for PT Re-Evaluation  10/23/19    Authorization Type  UHC    PT Start Time  0845    PT Stop Time  0935    PT Time Calculation (min)  50 min    Activity Tolerance  Patient limited by pain    Behavior During Therapy  [facility] for tasks assessed/performed       Past Medical History:  Diagnosis Date  . Anxiety   . DDD (degenerative disc disease), cervical   . Fatty liver   . GERD (gastroesophageal reflux disease)   . Hyperlipidemia   . Neuromuscular disorder ([place])    right ulnar nerve pain  . Seasonal allergies     Past Surgical History:  Procedure Laterality Date  . ANTERIOR CERVICAL DECOMPRESSION/DISCECTOMY FUSION 4 LEVELS N/A 10/25/2016   Procedure: ANTERIOR CERVICAL DECOMPRESSION/DISCECTOMY FUSION  CERVICAL THREE-CERVICAL FOUR  CERVICAL FOUR-CERVICAL FIVE ,CERVICAL FIVE-CERVICAL SIX ,CERVICAL SIX-CERVICAL SEVEN;  Surgeon: Jovita Gamma, MD;  Location: [facility];  Service: Neurosurgery;  Laterality: N/A;  . BRONCHOSCOPY    . HERNIA REPAIR Right    inguinal hernia  . WISDOM TOOTH EXTRACTION      There were no vitals filed for this visit.  Subjective Assessment - 09/18/19 0847    Subjective  Pt states that his Rt LE tingling resolved after he got to his car yesterday following his session. He currently has central low back pain and some tingling in the ball of his Lt foot.    Pertinent History  cervical fusion 2018, L3-5 fusion 02/2017    Patient Stated Goals  reduce LBP, sit/stand/walk longer without limitation    Currently in Pain?  Yes    Pain Score  7     Pain Location  Back     Pain Orientation  Lower;Right;Left    Pain Descriptors / Indicators  Aching    Pain Type  Chronic pain    Pain Radiating Towards  tingling in the ball of the Lt foot    Pain Onset  More than a month ago    Pain Frequency  Constant    Aggravating Factors   prolonged sitting (10-15 minutes)    Pain Relieving Factors  standing is ok; TENS                       [facility] Adult PT Treatment/Exercise - 09/18/19 0001      Lumbar Exercises: Supine   Other Supine Lumbar Exercises  Rt trunk rotation with LE on red physioball x10 reps       Knee/Hip Exercises: Standing   Hip Abduction  Right;Left;AROM;1 set    Abduction Limitations  standing on foam pad: 7 reps with Lt, 10 reps with Rt    Hip Extension  AROM;Right;Left;1 set    Extension Limitations  standing on foam pad: 7 reps with Lt, 10 reps with Rt      Knee/Hip Exercises: Supine   Other Supine Knee/Hip Exercises  BLE isometric hamstring curl x10 reps       Modalities   Modalities  Electrical Stimulation;Moist Heat  Moist Heat Therapy   Number Minutes Moist Heat  10 Minutes    Moist Heat Location  Lumbar Spine      Electrical Stimulation   Electrical Stimulation Location  lumbar spine     Electrical Stimulation Action  IFC    Electrical Stimulation Parameters  x10 min, intensity to pt tolerance    Electrical Stimulation Goals  Pain      Manual Therapy   Manual therapy comments  STM Lt QL in sidelying; rolling stick Lt ITB             PT Education - 09/18/19 0925    Education Details  technique with therex    Person(s) Educated  Patient    Methods  Explanation;Verbal cues    Comprehension  Verbalized understanding;Returned demonstration       PT Short Term Goals - 09/08/19 1154      PT SHORT TERM GOAL #1   Title  be independent in initial HEP    Baseline  moderate consistency    Time  4    Period  Weeks    Status  On-going        PT Long Term Goals - 08/28/19 1317      PT LONG TERM  GOAL #1   Title  be independent in advanced HEP    Time  8    Period  Weeks    Status  New    Target Date  10/23/19      PT LONG TERM GOAL #2   Title  reduce FOTO to < or = to 41% limitation    Time  8    Period  Weeks    Status  New    Target Date  10/23/19      PT LONG TERM GOAL #3   Title  report a 60% reduction in LBP to allow for riding in the car for longer periods    Time  8    Period  Weeks    Status  New    Target Date  10/23/19      PT LONG TERM GOAL #4   Title  report waking < or = to 2x/night with LBP to improve quality and quantity of sleep    Time  8    Period  Weeks    Status  New    Target Date  10/23/19      PT LONG TERM GOAL #5   Title  --            Plan - 09/18/19 0926    Clinical Impression Statement  Pt arrives with continued reports of Lt LE radicular symptoms and central low back pain. Focused primarily on LE strengthening while maintaining neutral spine today. Pt did have significant muscle fatigue on the Lt with all exercises. Pt has palpable muscle tension along the Lt lumbar/hip region, and PT completed gentle soft tissue mobilization to the area, but pt remained fairly guarded during this. Ended with moist heat and TENS modality for pain relief and to encourage relaxation of the lumbar musculature. Pt plans to follow up with MD concerning symptoms in the coming week.    Rehab Potential  Good    PT Treatment/Interventions  ADLs/Self Care Home Management;Cryotherapy;Electrical Stimulation;Ultrasound;Moist Heat;Functional mobility training;Therapeutic activities;Therapeutic exercise;Neuromuscular re-education;Manual techniques;Patient/family education;Passive range of motion;Dry needling;Joint Manipulations;Taping    PT Next Visit Plan  Neutral spine exercise. STM as needed for gluteals    PT Home Exercise Plan  Access Code: [email or web address]  Consulted and Agree with Plan of Care  Patient       Patient will benefit from skilled therapeutic  intervention in order to improve the following deficits and impairments:  Decreased activity tolerance, Decreased balance, Decreased strength, Decreased mobility, Decreased endurance, Decreased range of motion, Postural dysfunction, Improper body mechanics, Impaired flexibility, Pain, Increased muscle spasms  Visit Diagnosis: Chronic right-sided low back pain with right-sided sciatica  Muscle weakness (generalized)  Muscle spasm of back     Problem List Patient Active Problem List   Diagnosis Date Noted  . Dehydration 03/08/2017  . Lumbar stenosis 03/05/2017  . HNP (herniated nucleus pulposus), cervical 10/25/2016    9:48 AM,09/18/19 Sherol Dade PT, DPT [facility] [address] [place], [place], [address] Phone: [phone]   Fax:  [phone]  Name: CHANTE MIDENCE MRN: [id number] Date of Birth: Sep 30, 1975

## 2019-09-22 ENCOUNTER — Ambulatory Visit: Payer: 59 | Attending: Neurosurgery

## 2019-09-22 ENCOUNTER — Other Ambulatory Visit: Payer: Self-pay

## 2019-09-22 DIAGNOSIS — M62830 Muscle spasm of back: Secondary | ICD-10-CM | POA: Diagnosis present

## 2019-09-22 DIAGNOSIS — G8929 Other chronic pain: Secondary | ICD-10-CM

## 2019-09-22 DIAGNOSIS — M5441 Lumbago with sciatica, right side: Secondary | ICD-10-CM | POA: Diagnosis present

## 2019-09-22 DIAGNOSIS — M6281 Muscle weakness (generalized): Secondary | ICD-10-CM | POA: Insufficient documentation

## 2019-09-22 NOTE — Therapy (Signed)
[facility] [address] [place], [place], [address] Phone: [phone]   Fax:  [phone]  Physical Therapy Treatment  Patient Details  Name: Brian Tran MRN: [id number] Date of Birth: [birth year] Referring Provider (Brian Tran): Jovita Gamma, MD   Encounter Date: 09/22/2019  Brian Tran End of Session - 09/22/19 0923    Visit Number  7    Date for Brian Tran Re-Evaluation  10/23/19    Authorization Type  UHC    Brian Tran Start Time  0845    Brian Tran Stop Time  0920    Brian Tran Time Calculation (min)  35 min    Activity Tolerance  Patient limited by pain   Gluteal aggravation with excercise   Behavior During Therapy  [facility] for tasks assessed/performed       Past Medical History:  Diagnosis Date  . Anxiety   . DDD (degenerative disc disease), cervical   . Fatty liver   . GERD (gastroesophageal reflux disease)   . Hyperlipidemia   . Neuromuscular disorder ([place])    right ulnar nerve pain  . Seasonal allergies     Past Surgical History:  Procedure Laterality Date  . ANTERIOR CERVICAL DECOMPRESSION/DISCECTOMY FUSION 4 LEVELS N/A 10/25/2016   Procedure: ANTERIOR CERVICAL DECOMPRESSION/DISCECTOMY FUSION  CERVICAL THREE-CERVICAL FOUR  CERVICAL FOUR-CERVICAL FIVE ,CERVICAL FIVE-CERVICAL SIX ,CERVICAL SIX-CERVICAL SEVEN;  Surgeon: Jovita Gamma, MD;  Location: [facility];  Service: Neurosurgery;  Laterality: N/A;  . BRONCHOSCOPY    . HERNIA REPAIR Right    inguinal hernia  . WISDOM TOOTH EXTRACTION      There were no vitals filed for this visit.  Subjective Assessment - 09/22/19 0847    Subjective  I'm still tingling in my Rt leg.  I had tingling in both sides over the weekend.  I called Dr Sherwood Gambler and scheduled for February.  I might call back since the tingling is in both legs this weekend.    Pertinent History  cervical fusion 2018, L3-5 fusion 02/2017    Currently in Pain?  Yes    Pain Score  2     Pain Location  Back    Pain Orientation  Left     Pain Descriptors / Indicators  Aching    Pain Radiating Towards  pins and needles in bil legs    Pain Onset  More than a month ago    Pain Frequency  Constant    Aggravating Factors   increased activity, sitting too long    Pain Relieving Factors  TENs                       [facility] Adult Brian Tran Treatment/Exercise - 09/22/19 0001      Lumbar Exercises: Aerobic   Nustep  Level 4 x 8 minutes   Brian Tran tolerated increased resistance well today.       Lumbar Exercises: Supine   Other Supine Lumbar Exercises  Rt trunk rotation with LE on red physioball x10 reps       Lumbar Exercises: Sidelying   Clam  Both;20 reps      Knee/Hip Exercises: Standing   Hip Abduction  Right;Left;AROM;1 set    Abduction Limitations  standing on foam pad: 7 reps with Lt, 10 reps with Rt    Hip Extension  AROM;Right;Left;1 set    Extension Limitations  standing on foam pad: 7 reps with Lt, 10 reps with Rt      Knee/Hip Exercises: Supine   Other Supine  Knee/Hip Exercises  BLE isometric hamstring curl on red ball x10 reps       Manual Therapy   Manual therapy comments  STM Lt QL in sidelying; rolling stick Lt ITB               Brian Tran Short Term Goals - 09/22/19 0850      Brian Tran SHORT TERM GOAL #1   Title  be independent in initial HEP    Baseline  --    Status  Achieved      Brian Tran SHORT TERM GOAL #2   Title  report < or = to 4/10 LBP with sitting for work and riding in the car    Baseline  up to 7/10    Time  4    Period  Weeks    Status  On-going      Brian Tran SHORT TERM GOAL #3   Title  verbalize and demonstrate correct body mechanics modificaions for lumbar protection with lifting and household tasks    Status  Achieved        Brian Tran Long Term Goals - 08/28/19 1317      Brian Tran LONG TERM GOAL #1   Title  be independent in advanced HEP    Time  8    Period  Weeks    Status  New    Target Date  10/23/19      Brian Tran LONG TERM GOAL #2   Title  reduce FOTO to < or = to 41% limitation    Time  8     Period  Weeks    Status  New    Target Date  10/23/19      Brian Tran LONG TERM GOAL #3   Title  report a 60% reduction in LBP to allow for riding in the car for longer periods    Time  8    Period  Weeks    Status  New    Target Date  10/23/19      Brian Tran LONG TERM GOAL #4   Title  report waking < or = to 2x/night with LBP to improve quality and quantity of sleep    Time  8    Period  Weeks    Status  New    Target Date  10/23/19      Brian Tran LONG TERM GOAL #5   Title  --            Plan - 09/22/19 0902    Clinical Impression Statement  Brian Tran continues to reports Lt>Rt lumbar pain and now bil LE radiculopathy.  Lt LE symptoms are more prominent as is usual for this patient.  Brian Tran demonstrated muscular fatigue with NuStep and standing hip exercises on foam today. Brian Tran provided tactile cues for pelvis position with standing exercise.  Brian Tran with tension in the Lt quadratus and demonstrated improved mobility and reduced pain after manual therapy today.  Brian Tran will continue to benefit from skilled Brian Tran for neutral core and hip strength and pain management as needed.    Brian Tran Frequency  2x / week    Brian Tran Duration  8 weeks    Brian Tran Treatment/Interventions  ADLs/Self Care Home Management;Cryotherapy;Electrical Stimulation;Ultrasound;Moist Heat;Functional mobility training;Therapeutic activities;Therapeutic exercise;Neuromuscular re-education;Manual techniques;Patient/family education;Passive range of motion;Dry needling;Joint Manipulations;Taping    Brian Tran Next Visit Plan  Neutral spine exercise. STM as needed for gluteals    Brian Tran Home Exercise Plan  Access Code: [email or web address]    Consulted and Agree with Plan of Care  Patient  Patient will benefit from skilled therapeutic intervention in order to improve the following deficits and impairments:  Decreased activity tolerance, Decreased balance, Decreased strength, Decreased mobility, Decreased endurance, Decreased range of motion, Postural dysfunction, Improper body mechanics,  Impaired flexibility, Pain, Increased muscle spasms  Visit Diagnosis: Chronic right-sided low back pain with right-sided sciatica  Muscle weakness (generalized)  Muscle spasm of back     Problem List Patient Active Problem List   Diagnosis Date Noted  . Dehydration 03/08/2017  . Lumbar stenosis 03/05/2017  . HNP (herniated nucleus pulposus), cervical 10/25/2016    Brian Tran, Brian Tran 09/22/19 9:25 AM  [facility] [address] [place], [place], [address] Phone: [phone]   Fax:  [phone]  Name: Brian Tran MRN: [id number] Date of Birth: 1976-04-01

## 2019-09-23 ENCOUNTER — Ambulatory Visit (HOSPITAL_COMMUNITY)
Admission: RE | Admit: 2019-09-23 | Discharge: 2019-09-23 | Disposition: A | Discharge status: Home / Self Care | Payer: 59 | Source: Ambulatory Visit | Attending: Gastroenterology | Admitting: Gastroenterology

## 2019-09-23 ENCOUNTER — Other Ambulatory Visit: Payer: Self-pay

## 2019-09-23 DIAGNOSIS — K219 Gastro-esophageal reflux disease without esophagitis: Secondary | ICD-10-CM

## 2019-09-24 ENCOUNTER — Ambulatory Visit: Payer: 59

## 2019-09-24 DIAGNOSIS — M5441 Lumbago with sciatica, right side: Secondary | ICD-10-CM

## 2019-09-24 DIAGNOSIS — G8929 Other chronic pain: Secondary | ICD-10-CM

## 2019-09-24 DIAGNOSIS — M62830 Muscle spasm of back: Secondary | ICD-10-CM

## 2019-09-24 DIAGNOSIS — M6281 Muscle weakness (generalized): Secondary | ICD-10-CM

## 2019-09-24 NOTE — Therapy (Signed)
[facility] [address] [place], [place], [address] Phone: [phone]   Fax:  [phone]  Physical Therapy Treatment  Patient Details  Name: Brian Tran MRN: [id number] Date of Birth: 12-23-1975 Referring Provider (PT): Jovita Gamma, MD   Encounter Date: 09/24/2019  PT End of Session - 09/24/19 0921    Visit Number  8    Date for PT Re-Evaluation  10/23/19    Authorization Type  [employer]    PT Start Time  0844    PT Stop Time  0924    PT Time Calculation (min)  40 min    Activity Tolerance  Patient limited by pain    Behavior During Therapy  [facility] for tasks assessed/performed       Past Medical History:  Diagnosis Date  . Anxiety   . DDD (degenerative disc disease), cervical   . Fatty liver   . GERD (gastroesophageal reflux disease)   . Hyperlipidemia   . Neuromuscular disorder ([place])    right ulnar nerve pain  . Seasonal allergies     Past Surgical History:  Procedure Laterality Date  . ANTERIOR CERVICAL DECOMPRESSION/DISCECTOMY FUSION 4 LEVELS N/A 10/25/2016   Procedure: ANTERIOR CERVICAL DECOMPRESSION/DISCECTOMY FUSION  CERVICAL THREE-CERVICAL FOUR  CERVICAL FOUR-CERVICAL FIVE ,CERVICAL FIVE-CERVICAL SIX ,CERVICAL SIX-CERVICAL SEVEN;  Surgeon: Jovita Gamma, MD;  Location: [place];  Service: Neurosurgery;  Laterality: N/A;  . BRONCHOSCOPY    . HERNIA REPAIR Right    inguinal hernia  . WISDOM TOOTH EXTRACTION      There were no vitals filed for this visit.  Subjective Assessment - 09/24/19 0846    Subjective  I am having a lot of LBP today. I didn't sleep last night.  I contacted the MD and he hasn't called me back.    Pertinent History  cervical fusion 2018, L3-5 fusion 02/2017    Patient Stated Goals  reduce LBP, sit/stand/walk longer without limitation    Currently in Pain?  Yes    Pain Score  7     Pain Location  Back    Pain Orientation  Left    Pain Descriptors / Indicators  Aching    Pain  Type  Chronic pain    Pain Onset  More than a month ago    Pain Frequency  Constant    Aggravating Factors   it just hurts    Pain Relieving Factors  change of position, Tylenol                       [facility] Adult PT Treatment/Exercise - 09/24/19 0001      Modalities   Modalities  Electrical Stimulation;Moist Heat      Moist Heat Therapy   Number Minutes Moist Heat  20 Minutes    Moist Heat Location  Lumbar Spine      Electrical Stimulation   Electrical Stimulation Location  lumbar spine     Electrical Stimulation Action  IFC    Electrical Stimulation Parameters  15 minutes    Electrical Stimulation Goals  Pain      Manual Therapy   Manual Therapy  Soft tissue mobilization;Myofascial release    Manual therapy comments  gentle soft tissue work to [employer] thoracolumbar paraspinals and Rt lumbar paraspinals in prone- to pt tolerance               PT Short Term Goals - 09/22/19 0850      PT SHORT TERM  GOAL #1   Title  be independent in initial HEP    Baseline  --    Status  Achieved      PT SHORT TERM GOAL #2   Title  report < or = to 4/10 LBP with sitting for work and riding in the car    Baseline  up to 7/10    Time  4    Period  Weeks    Status  On-going      PT SHORT TERM GOAL #3   Title  verbalize and demonstrate correct body mechanics modificaions for lumbar protection with lifting and household tasks    Status  Achieved        PT Long Term Goals - 08/28/19 1317      PT LONG TERM GOAL #1   Title  be independent in advanced HEP    Time  8    Period  Weeks    Status  New    Target Date  10/23/19      PT LONG TERM GOAL #2   Title  reduce FOTO to < or = to 41% limitation    Time  8    Period  Weeks    Status  New    Target Date  10/23/19      PT LONG TERM GOAL #3   Title  report a 60% reduction in LBP to allow for riding in the car for longer periods    Time  8    Period  Weeks    Status  New    Target Date  10/23/19      PT LONG  TERM GOAL #4   Title  report waking < or = to 2x/night with LBP to improve quality and quantity of sleep    Time  8    Period  Weeks    Status  New    Target Date  10/23/19      PT LONG TERM GOAL #5   Title  --            Plan - 09/24/19 0920    Clinical Impression Statement  Pt arrived today with significant pain-7/10 in the Lt thoracic/lumbar spine.  Pt didn't sleep last night due to pain. Pt was hypersensitive and guarded with gentle soft tissue work.  Tension lessened with time and PT was able to provide additional pressure.  Pt too painful to participate in exercise today.  Pt has reached out to the doctor and will do this again today.  Pt will continue to benefit from skilled PT to reduce pain and improve hip and core strength.    Rehab Potential  Good    PT Frequency  2x / week    PT Duration  8 weeks    PT Treatment/Interventions  ADLs/Self Care Home Management;Cryotherapy;Electrical Stimulation;Ultrasound;Moist Heat;Functional mobility training;Therapeutic activities;Therapeutic exercise;Neuromuscular re-education;Manual techniques;Patient/family education;Passive range of motion;Dry needling;Joint Manipulations;Taping    PT Next Visit Plan  address pain if needed, neutral spine exercise, soft tissue work for muscle tension    PT Home Exercise Plan  Access Code: [email or web address]    Consulted and Agree with Plan of Care  Patient       Patient will benefit from skilled therapeutic intervention in order to improve the following deficits and impairments:  Decreased activity tolerance, Decreased balance, Decreased strength, Decreased mobility, Decreased endurance, Decreased range of motion, Postural dysfunction, Improper body mechanics, Impaired flexibility, Pain, Increased muscle spasms  Visit Diagnosis: Chronic right-sided low back  pain with right-sided sciatica  Muscle weakness (generalized)  Muscle spasm of back     Problem List Patient Active Problem List   Diagnosis Date  Noted  . Dehydration 03/08/2017  . Lumbar stenosis 03/05/2017  . HNP (herniated nucleus pulposus), cervical 10/25/2016    Brian Tran, PT 09/24/19 9:23 AM   [facility] [address], [place], [place], [address] Phone: [phone]   Fax:  [phone]  Name: Brian Tran MRN: [id number] Date of Birth: 06/18/1976

## 2019-09-25 ENCOUNTER — Other Ambulatory Visit: Payer: Self-pay

## 2019-09-25 ENCOUNTER — Ambulatory Visit (HOSPITAL_COMMUNITY)
Admission: RE | Admit: 2019-09-25 | Discharge: 2019-09-25 | Disposition: A | Discharge status: Home / Self Care | Payer: 59 | Source: Ambulatory Visit | Attending: Gastroenterology | Admitting: Gastroenterology

## 2019-09-25 ENCOUNTER — Encounter (HOSPITAL_COMMUNITY): Payer: Self-pay

## 2019-09-25 DIAGNOSIS — R1012 Left upper quadrant pain: Secondary | ICD-10-CM | POA: Insufficient documentation

## 2019-09-25 MED ORDER — IOHEXOL 300 MG/ML  SOLN
100.0000 mL | Freq: Once | INTRAMUSCULAR | Status: AC | PRN
  Administered 2019-09-25: 100 mL via INTRAVENOUS

## 2019-09-25 MED ORDER — SODIUM CHLORIDE (PF) 0.9 % IJ SOLN
INTRAMUSCULAR | Status: AC
  Filled 2019-09-25: qty 50

## 2019-09-26 ENCOUNTER — Other Ambulatory Visit: Payer: Self-pay

## 2019-09-26 ENCOUNTER — Telehealth: Payer: Self-pay

## 2019-09-26 MED ORDER — GABAPENTIN 300 MG PO CAPS: 300.0000 mg | ORAL_CAPSULE | Freq: Every day | ORAL | 3 refills | Status: DC

## 2019-09-26 NOTE — Telephone Encounter (Signed)
Left message to please call back. °

## 2019-09-29 ENCOUNTER — Other Ambulatory Visit: Payer: Self-pay

## 2019-09-29 ENCOUNTER — Ambulatory Visit: Payer: 59

## 2019-09-29 DIAGNOSIS — M62830 Muscle spasm of back: Secondary | ICD-10-CM

## 2019-09-29 DIAGNOSIS — G8929 Other chronic pain: Secondary | ICD-10-CM

## 2019-09-29 DIAGNOSIS — M5441 Lumbago with sciatica, right side: Secondary | ICD-10-CM

## 2019-09-29 DIAGNOSIS — M6281 Muscle weakness (generalized): Secondary | ICD-10-CM

## 2019-09-29 NOTE — Therapy (Signed)
[facility] [address], [place], [place], [address] Phone: [phone]   Fax:  [phone]  Physical Therapy Treatment  Patient Details  Name: Brian Tran MRN: [id number] Date of Birth: Sep 09, 1976 Referring Provider (PT): Jovita Gamma, MD   Encounter Date: 09/29/2019  PT End of Session - 09/29/19 1006    Visit Number  9    Date for PT Re-Evaluation  10/23/19    Authorization Type  UHC    PT Start Time  0922    PT Stop Time  1007    PT Time Calculation (min)  45 min    Activity Tolerance  Patient tolerated treatment well    Behavior During Therapy  [facility] for tasks assessed/performed       Past Medical History:  Diagnosis Date  . Anxiety   . DDD (degenerative disc disease), cervical   . Fatty liver   . GERD (gastroesophageal reflux disease)   . Hyperlipidemia   . Neuromuscular disorder ([place])    right ulnar nerve pain  . Seasonal allergies     Past Surgical History:  Procedure Laterality Date  . ANTERIOR CERVICAL DECOMPRESSION/DISCECTOMY FUSION 4 LEVELS N/A 10/25/2016   Procedure: ANTERIOR CERVICAL DECOMPRESSION/DISCECTOMY FUSION  CERVICAL THREE-CERVICAL FOUR  CERVICAL FOUR-CERVICAL FIVE ,CERVICAL FIVE-CERVICAL SIX ,CERVICAL SIX-CERVICAL SEVEN;  Surgeon: Jovita Gamma, MD;  Location: [facility];  Service: Neurosurgery;  Laterality: N/A;  . BRONCHOSCOPY    . HERNIA REPAIR Right    inguinal hernia  . WISDOM TOOTH EXTRACTION      There were no vitals filed for this visit.  Subjective Assessment - 09/29/19 0924    Subjective  MD sent me a message- he told me to transition to a HEP for strength.  There is nothing we can do about my leg tingling.    Pertinent History  cervical fusion 2018, L3-5 fusion 02/2017    Currently in Pain?  Yes    Pain Score  6     Pain Location  Back    Pain Orientation  Left    Pain Descriptors / Indicators  Aching;Tightness    Pain Onset  More than a month ago    Aggravating  Factors   it just hurts, sleep at night    Pain Relieving Factors  change of position, Tylenol, during the day                       [facility] Adult PT Treatment/Exercise - 09/29/19 0001      Lumbar Exercises: Stretches   Active Hamstring Stretch  Right;Left;20 seconds;2 reps      Lumbar Exercises: Aerobic   UBE (Upper Arm Bike)  standing with neutral posture: Level 1x 6 minutes (3/3)    Nustep  Level 3 x 10 minutes      Lumbar Exercises: Seated   Sit to Stand  20 reps    Sit to Stand Limitations  with core activation      Lumbar Exercises: Supine   Ab Set  20 reps;5 seconds    Clam  20 reps    Clam Limitations  blue band      Knee/Hip Exercises: Standing   Other Standing Knee Exercises  standing on black pad: diagonals yellow ball 2x10 each      Knee/Hip Exercises: Seated   Abd/Adduction Limitations  press on foam roll for core activation 2x10             PT Education -  09/29/19 1003    Education Details  [employer] app, sleep hygiene    Person(s) Educated  Patient    Methods  Explanation;Handout    Comprehension  Verbalized understanding       PT Short Term Goals - 09/22/19 0850      PT SHORT TERM GOAL #1   Title  be independent in initial HEP    Baseline  --    Status  Achieved      PT SHORT TERM GOAL #2   Title  report < or = to 4/10 LBP with sitting for work and riding in the car    Baseline  up to 7/10    Time  4    Period  Weeks    Status  On-going      PT SHORT TERM GOAL #3   Title  verbalize and demonstrate correct body mechanics modificaions for lumbar protection with lifting and household tasks    Status  Achieved        PT Long Term Goals - 08/28/19 1317      PT LONG TERM GOAL #1   Title  be independent in advanced HEP    Time  8    Period  Weeks    Status  New    Target Date  10/23/19      PT LONG TERM GOAL #2   Title  reduce FOTO to < or = to 41% limitation    Time  8    Period  Weeks    Status  New    Target Date   10/23/19      PT LONG TERM GOAL #3   Title  report a 60% reduction in LBP to allow for riding in the car for longer periods    Time  8    Period  Weeks    Status  New    Target Date  10/23/19      PT LONG TERM GOAL #4   Title  report waking < or = to 2x/night with LBP to improve quality and quantity of sleep    Time  8    Period  Weeks    Status  New    Target Date  10/23/19      PT LONG TERM GOAL #5   Title  --            Plan - 09/29/19 0945    Clinical Impression Statement  Pt with continued LBP today and is having sleep interruption related to this.  LE pain is better today.  Session focused on pain neuroscience education and sleep hygiene.  Pt plans to download an app focusing on pain neuroscience education.  Pt required verbal cues with exercise for core activation with breathing.  Pt required tactile cues for neutral posture in standing with exercise. Pt with chronic hip and core weakness and will continue to benefit from skilled PT to address and pain management to address chronic pain.    Examination-Activity Limitations  Bed Mobility;Bend;Caring for Others;Dressing;Sleep;Stand;Transfers;Locomotion Level    PT Frequency  2x / week    PT Duration  8 weeks    PT Treatment/Interventions  ADLs/Self Care Home Management;Cryotherapy;Electrical Stimulation;Ultrasound;Moist Heat;Functional mobility training;Therapeutic activities;Therapeutic exercise;Neuromuscular re-education;Manual techniques;Patient/family education;Passive range of motion;Dry needling;Joint Manipulations;Taping    PT Next Visit Plan  address pain if needed, neutral spine exercise, soft tissue work for muscle tension    PT Home Exercise Plan  Access Code: [email or web address]  Patient will benefit from skilled therapeutic intervention in order to improve the following deficits and impairments:  Decreased activity tolerance, Decreased balance, Decreased strength, Decreased mobility, Decreased endurance, Decreased  range of motion, Postural dysfunction, Improper body mechanics, Impaired flexibility, Pain, Increased muscle spasms  Visit Diagnosis: Chronic right-sided low back pain with right-sided sciatica  Muscle weakness (generalized)  Muscle spasm of back     Problem List Patient Active Problem List   Diagnosis Date Noted  . Dehydration 03/08/2017  . Lumbar stenosis 03/05/2017  . HNP (herniated nucleus pulposus), cervical 10/25/2016    Sigurd Sos, PT 09/29/19 10:08 AM  [facility] [address], [place], [place], [address] Phone: [phone]   Fax:  [phone]  Name: STIHL BRUSSO MRN: [id number] Date of Birth: 04/16/1976

## 2019-10-01 ENCOUNTER — Ambulatory Visit: Payer: 59

## 2019-10-01 ENCOUNTER — Other Ambulatory Visit: Payer: Self-pay

## 2019-10-01 DIAGNOSIS — M5441 Lumbago with sciatica, right side: Secondary | ICD-10-CM | POA: Diagnosis not present

## 2019-10-01 DIAGNOSIS — M6281 Muscle weakness (generalized): Secondary | ICD-10-CM

## 2019-10-01 DIAGNOSIS — G8929 Other chronic pain: Secondary | ICD-10-CM

## 2019-10-01 DIAGNOSIS — M62830 Muscle spasm of back: Secondary | ICD-10-CM

## 2019-10-01 NOTE — Therapy (Signed)
[facility] [address] [place], [place], [address] Phone: [phone]   Fax:  [phone]  Physical Therapy Treatment  Patient Details  Name: Brian Tran MRN: [id number] Date of Birth: 24-Dec-1975 Referring Provider (PT): Jovita Gamma, MD   Encounter Date: 10/01/2019  PT End of Session - 10/01/19 0832    Visit Number  10    Date for PT Re-Evaluation  10/23/19    Authorization Type  UHC    PT Start Time  0751    PT Stop Time  0837    PT Time Calculation (min)  46 min    Activity Tolerance  Patient tolerated treatment well    Behavior During Therapy  [facility] for tasks assessed/performed       Past Medical History:  Diagnosis Date  . Anxiety   . DDD (degenerative disc disease), cervical   . Fatty liver   . GERD (gastroesophageal reflux disease)   . Hyperlipidemia   . Neuromuscular disorder ([place])    right ulnar nerve pain  . Seasonal allergies     Past Surgical History:  Procedure Laterality Date  . ANTERIOR CERVICAL DECOMPRESSION/DISCECTOMY FUSION 4 LEVELS N/A 10/25/2016   Procedure: ANTERIOR CERVICAL DECOMPRESSION/DISCECTOMY FUSION  CERVICAL THREE-CERVICAL FOUR  CERVICAL FOUR-CERVICAL FIVE ,CERVICAL FIVE-CERVICAL SIX ,CERVICAL SIX-CERVICAL SEVEN;  Surgeon: Jovita Gamma, MD;  Location: [facility];  Service: Neurosurgery;  Laterality: N/A;  . BRONCHOSCOPY    . HERNIA REPAIR Right    inguinal hernia  . WISDOM TOOTH EXTRACTION      There were no vitals filed for this visit.  Subjective Assessment - 10/01/19 0757    Subjective  I have started using the [employer] app and I am going to start to meditate.  I have been working on my posture.    Pertinent History  cervical fusion 2018, L3-5 fusion 02/2017    Currently in Pain?  Yes    Pain Score  6     Pain Location  Back    Pain Orientation  Left    Pain Descriptors / Indicators  Aching;Tightness                       [facility] Adult PT  Treatment/Exercise - 10/01/19 0001      Lumbar Exercises: Stretches   Lower Trunk Rotation  3 reps;20 seconds    Lower Trunk Rotation Limitations  legs on ball      Lumbar Exercises: Aerobic   UBE (Upper Arm Bike)  standing with neutral posture: Level 1x 6 minutes (3/3)    Nustep  Level 3 x 9 minutes- legs only      Lumbar Exercises: Standing   Shoulder Extension  Strengthening;Power Tower;Both;20 reps    Other Standing Lumbar Exercises  walk in reverse with abdominal bracing 25# 2x10      Lumbar Exercises: Seated   Other Seated Lumbar Exercises  seated on green ball: green band horizontal abduction 2x10      Lumbar Exercises: Supine   Clam  20 reps    Clam Limitations  blue band      Knee/Hip Exercises: Stretches   Active Hamstring Stretch  Both;3 reps;20 seconds               PT Short Term Goals - 09/22/19 0850      PT SHORT TERM GOAL #1   Title  be independent in initial HEP    Baseline  --    Status  Achieved      PT SHORT TERM GOAL #2   Title  report < or = to 4/10 LBP with sitting for work and riding in the car    Baseline  up to 7/10    Time  4    Period  Weeks    Status  On-going      PT SHORT TERM GOAL #3   Title  verbalize and demonstrate correct body mechanics modificaions for lumbar protection with lifting and household tasks    Status  Achieved        PT Long Term Goals - 08/28/19 1317      PT LONG TERM GOAL #1   Title  be independent in advanced HEP    Time  8    Period  Weeks    Status  New    Target Date  10/23/19      PT LONG TERM GOAL #2   Title  reduce FOTO to < or = to 41% limitation    Time  8    Period  Weeks    Status  New    Target Date  10/23/19      PT LONG TERM GOAL #3   Title  report a 60% reduction in LBP to allow for riding in the car for longer periods    Time  8    Period  Weeks    Status  New    Target Date  10/23/19      PT LONG TERM GOAL #4   Title  report waking < or = to 2x/night with LBP to improve  quality and quantity of sleep    Time  8    Period  Weeks    Status  New    Target Date  10/23/19      PT LONG TERM GOAL #5   Title  --            Plan - 10/01/19 [id number]    Clinical Impression Statement  Pt reports that he is sleeping better due to reduced pain.  Pt has been working on postural corrections at home and core activation.  Session focused on core and hip strength, postural alignment and flexibility.  Pt required tactile and verbal cues for alignment, engagement of the core and to avoid knee flexion with hamstring stretch.  Pt will continue to benefit from skilled PT to address chronic pain and functional strength deficits.    PT Frequency  2x / week    PT Duration  8 weeks    PT Treatment/Interventions  ADLs/Self Care Home Management;Cryotherapy;Electrical Stimulation;Ultrasound;Moist Heat;Functional mobility training;Therapeutic activities;Therapeutic exercise;Neuromuscular re-education;Manual techniques;Patient/family education;Passive range of motion;Dry needling;Joint Manipulations;Taping    PT Next Visit Plan  core and hip strength, neutral spine    PT Home Exercise Plan  Access Code: [email or web address]    Consulted and Agree with Plan of Care  Patient       Patient will benefit from skilled therapeutic intervention in order to improve the following deficits and impairments:  Decreased activity tolerance, Decreased balance, Decreased strength, Decreased mobility, Decreased endurance, Decreased range of motion, Postural dysfunction, Improper body mechanics, Impaired flexibility, Pain, Increased muscle spasms  Visit Diagnosis: Chronic right-sided low back pain with right-sided sciatica  Muscle weakness (generalized)  Muscle spasm of back     Problem List Patient Active Problem List   Diagnosis Date Noted  . Dehydration 03/08/2017  . Lumbar stenosis 03/05/2017  . HNP (herniated nucleus pulposus), cervical 10/25/2016  Sigurd Sos, PT 10/01/19 8:35 AM  [facility] [address] [place], [place], [address] Phone: [phone]   Fax:  [phone]  Name: Brian Tran MRN: [id number] Date of Birth: [birth year]

## 2019-10-07 ENCOUNTER — Ambulatory Visit: Payer: 59

## 2019-10-07 ENCOUNTER — Other Ambulatory Visit: Payer: Self-pay

## 2019-10-07 DIAGNOSIS — M6281 Muscle weakness (generalized): Secondary | ICD-10-CM

## 2019-10-07 DIAGNOSIS — M5441 Lumbago with sciatica, right side: Secondary | ICD-10-CM | POA: Diagnosis not present

## 2019-10-07 DIAGNOSIS — G8929 Other chronic pain: Secondary | ICD-10-CM

## 2019-10-07 DIAGNOSIS — M62830 Muscle spasm of back: Secondary | ICD-10-CM

## 2019-10-07 NOTE — Therapy (Signed)
[facility] [address], [place], [place], [address] Phone: [phone]   Fax:  [phone]  Physical Therapy Treatment  Patient Details  Name: Brian Tran MRN: [id number] Date of Birth: [birth year] Referring Provider (PT): Jovita Gamma, MD   Encounter Date: 10/07/2019  PT End of Session - 10/07/19 1313    Visit Number  11    Date for PT Re-Evaluation  10/23/19    Authorization Type  [employer]    PT Start Time  1232   dry needling   PT Stop Time  1310    PT Time Calculation (min)  38 min    Activity Tolerance  Patient limited by pain    Behavior During Therapy  [facility] for tasks assessed/performed       Past Medical History:  Diagnosis Date  . Anxiety   . DDD (degenerative disc disease), cervical   . Fatty liver   . GERD (gastroesophageal reflux disease)   . Hyperlipidemia   . Neuromuscular disorder ([place])    right ulnar nerve pain  . Seasonal allergies     Past Surgical History:  Procedure Laterality Date  . ANTERIOR CERVICAL DECOMPRESSION/DISCECTOMY FUSION 4 LEVELS N/A 10/25/2016   Procedure: ANTERIOR CERVICAL DECOMPRESSION/DISCECTOMY FUSION  CERVICAL THREE-CERVICAL FOUR  CERVICAL FOUR-CERVICAL FIVE ,CERVICAL FIVE-CERVICAL SIX ,CERVICAL SIX-CERVICAL SEVEN;  Surgeon: Jovita Gamma, MD;  Location: [facility];  Service: Neurosurgery;  Laterality: N/A;  . BRONCHOSCOPY    . HERNIA REPAIR Right    inguinal hernia  . WISDOM TOOTH EXTRACTION      There were no vitals filed for this visit.  Subjective Assessment - 10/07/19 1232    Subjective  I am having so much pain in my low back.  I woke up with pain last night.    Currently in Pain?  Yes    Pain Score  8     Pain Location  Back    Pain Orientation  Left;Right    Pain Descriptors / Indicators  Aching;Dull;Tightness    Pain Type  Chronic pain    Pain Onset  More than a month ago    Pain Frequency  Constant    Aggravating Factors   it just hurts, sleep at  night    Pain Relieving Factors  change of position, Tylenol                       [facility] Adult PT Treatment/Exercise - 10/07/19 0001      Lumbar Exercises: Aerobic   Nustep  Level 3 x 10 minutes- legs only   PT present to discuss status     Manual Therapy   Manual Therapy  Soft tissue mobilization;Myofascial release    Manual therapy comments  bil quadratus in sidelying       Trigger Point Dry Needling - 10/07/19 0001    Consent Given?  Yes    Muscles Treated Back/Hip  Quadratus lumborum    Lumbar multifidi Response  --    Quadratus Lumborum Response  Twitch response elicited;Palpable increased muscle length             PT Short Term Goals - 09/22/19 0850      PT SHORT TERM GOAL #1   Title  be independent in initial HEP    Baseline  --    Status  Achieved      PT SHORT TERM GOAL #2   Title  report < or = to 4/10 LBP with  sitting for work and riding in the car    Baseline  up to 7/10    Time  4    Period  Weeks    Status  On-going      PT SHORT TERM GOAL #3   Title  verbalize and demonstrate correct body mechanics modificaions for lumbar protection with lifting and household tasks    Status  Achieved        PT Long Term Goals - 08/28/19 1317      PT LONG TERM GOAL #1   Title  be independent in advanced HEP    Time  8    Period  Weeks    Status  New    Target Date  10/23/19      PT LONG TERM GOAL #2   Title  reduce FOTO to < or = to 41% limitation    Time  8    Period  Weeks    Status  New    Target Date  10/23/19      PT LONG TERM GOAL #3   Title  report a 60% reduction in LBP to allow for riding in the car for longer periods    Time  8    Period  Weeks    Status  New    Target Date  10/23/19      PT LONG TERM GOAL #4   Title  report waking < or = to 2x/night with LBP to improve quality and quantity of sleep    Time  8    Period  Weeks    Status  New    Target Date  10/23/19      PT LONG TERM GOAL #5   Title  --             Plan - 10/07/19 1312    Clinical Impression Statement  Pt entered today in 8/10 pain and has been having sleep limitations.  Pt demonstrated significant tension and hypersensitivity in bil quadratus.  Session focused on dry needling and manual to release the quadratus bilaterally.  Pt had good response and pain levels reduced to 2/10 after session.  Pt was advised to stretch consistently to maintain muscle length.  Pt will continue to benefit from skilled PT to address chronic lumbar pain, weakness and muscle tension.    PT Frequency  2x / week    PT Duration  8 weeks    PT Treatment/Interventions  ADLs/Self Care Home Management;Cryotherapy;Electrical Stimulation;Ultrasound;Moist Heat;Functional mobility training;Therapeutic activities;Therapeutic exercise;Neuromuscular re-education;Manual techniques;Patient/family education;Passive range of motion;Dry needling;Joint Manipulations;Taping    PT Next Visit Plan  core and hip strength, neutral spine.  Assess response to dry needling to quadratus.    PT Home Exercise Plan  Access Code: [email or web address]       Patient will benefit from skilled therapeutic intervention in order to improve the following deficits and impairments:  Decreased activity tolerance, Decreased balance, Decreased strength, Decreased mobility, Decreased endurance, Decreased range of motion, Postural dysfunction, Improper body mechanics, Impaired flexibility, Pain, Increased muscle spasms  Visit Diagnosis: Muscle weakness (generalized)  Muscle spasm of back  Chronic right-sided low back pain with right-sided sciatica     Problem List Patient Active Problem List   Diagnosis Date Noted  . Dehydration 03/08/2017  . Lumbar stenosis 03/05/2017  . HNP (herniated nucleus pulposus), cervical 10/25/2016     Brian Tran, PT 10/07/19 1:16 PM  [facility] [address], [place], [place],  [id number] Phone:  [phone]   Fax:  [phone]  Name: Brian Tran MRN: [id number] Date of Birth: 03/31/1976

## 2019-10-13 ENCOUNTER — Other Ambulatory Visit: Payer: Self-pay

## 2019-10-13 ENCOUNTER — Ambulatory Visit: Payer: 59

## 2019-10-13 DIAGNOSIS — M6281 Muscle weakness (generalized): Secondary | ICD-10-CM

## 2019-10-13 DIAGNOSIS — M5441 Lumbago with sciatica, right side: Secondary | ICD-10-CM | POA: Diagnosis not present

## 2019-10-13 DIAGNOSIS — G8929 Other chronic pain: Secondary | ICD-10-CM

## 2019-10-13 DIAGNOSIS — M62830 Muscle spasm of back: Secondary | ICD-10-CM

## 2019-10-13 NOTE — Therapy (Signed)
[facility] [address] [place], [place], [address] Phone: [phone]   Fax:  [phone]  Physical Therapy Treatment  Patient Details  Name: Brian Tran MRN: [id number] Date of Birth: May 05, 1976 Referring Provider (PT): Jovita Gamma, MD   Encounter Date: 10/13/2019  PT End of Session - 10/13/19 0921    Visit Number  12    Date for PT Re-Evaluation  10/23/19    Authorization Type  UHC    PT Start Time  0846    PT Stop Time  0916    PT Time Calculation (min)  30 min    Activity Tolerance  Patient limited by pain    Behavior During Therapy  [facility] for tasks assessed/performed       Past Medical History:  Diagnosis Date  . Anxiety   . DDD (degenerative disc disease), cervical   . Fatty liver   . GERD (gastroesophageal reflux disease)   . Hyperlipidemia   . Neuromuscular disorder ([place])    right ulnar nerve pain  . Seasonal allergies     Past Surgical History:  Procedure Laterality Date  . ANTERIOR CERVICAL DECOMPRESSION/DISCECTOMY FUSION 4 LEVELS N/A 10/25/2016   Procedure: ANTERIOR CERVICAL DECOMPRESSION/DISCECTOMY FUSION  CERVICAL THREE-CERVICAL FOUR  CERVICAL FOUR-CERVICAL FIVE ,CERVICAL FIVE-CERVICAL SIX ,CERVICAL SIX-CERVICAL SEVEN;  Surgeon: Jovita Gamma, MD;  Location: [place];  Service: Neurosurgery;  Laterality: N/A;  . BRONCHOSCOPY    . HERNIA REPAIR Right    inguinal hernia  . WISDOM TOOTH EXTRACTION      There were no vitals filed for this visit.  Subjective Assessment - 10/13/19 0848    Subjective  I felt good for 3 days after needling.  Whatever you did helped.  The muscles are tight again.    Currently in Pain?  Yes    Pain Score  7     Pain Location  Back    Pain Orientation  Right;Left;Lower    Pain Descriptors / Indicators  Aching;Dull;Tightness    Pain Type  Chronic pain    Pain Onset  More than a month ago    Pain Frequency  Constant    Aggravating Factors   sleep at night     Pain Relieving Factors  stretching, dry needling, Tylenol                       [facility] Adult PT Treatment/Exercise - 10/13/19 0001      Manual Therapy   Manual Therapy  Soft tissue mobilization;Myofascial release    Manual therapy comments  bil quadratus in sidelying       Trigger Point Dry Needling - 10/13/19 0001    Consent Given?  Yes    Muscles Treated Back/Hip  Quadratus lumborum    Quadratus Lumborum Response  Twitch response elicited;Palpable increased muscle length             PT Short Term Goals - 09/22/19 0850      PT SHORT TERM GOAL #1   Title  be independent in initial HEP    Baseline  --    Status  Achieved      PT SHORT TERM GOAL #2   Title  report < or = to 4/10 LBP with sitting for work and riding in the car    Baseline  up to 7/10    Time  4    Period  Weeks    Status  On-going  PT SHORT TERM GOAL #3   Title  verbalize and demonstrate correct body mechanics modificaions for lumbar protection with lifting and household tasks    Status  Achieved        PT Long Term Goals - 08/28/19 1317      PT LONG TERM GOAL #1   Title  be independent in advanced HEP    Time  8    Period  Weeks    Status  New    Target Date  10/23/19      PT LONG TERM GOAL #2   Title  reduce FOTO to < or = to 41% limitation    Time  8    Period  Weeks    Status  New    Target Date  10/23/19      PT LONG TERM GOAL #3   Title  report a 60% reduction in LBP to allow for riding in the car for longer periods    Time  8    Period  Weeks    Status  New    Target Date  10/23/19      PT LONG TERM GOAL #4   Title  report waking < or = to 2x/night with LBP to improve quality and quantity of sleep    Time  8    Period  Weeks    Status  New    Target Date  10/23/19      PT LONG TERM GOAL #5   Title  --            Plan - 10/13/19 0919    Clinical Impression Statement  Pt had 3 days of relief after last session with dry needling and manual to  the quadratus lumborum bilaterally.  Pt arrived today with 6-7/10 pain in bil lumbar spine and reported immediate symptom reduction with dry needling to bil quadratus and manual therapy for tissue elongation.  Pt is independent and compliant in HEP for flexibility and strength and PT emphasized the importance of continued compliance to achieve greatest benefit from needling.  Pt will continue to benefit from skilled PT to address chronic LBP.    PT Frequency  2x / week    PT Duration  8 weeks    PT Treatment/Interventions  ADLs/Self Care Home Management;Cryotherapy;Electrical Stimulation;Ultrasound;Moist Heat;Functional mobility training;Therapeutic activities;Therapeutic exercise;Neuromuscular re-education;Manual techniques;Patient/family education;Passive range of motion;Dry needling;Joint Manipulations;Taping    PT Next Visit Plan  core and hip strength, neutral spine.  Assess response to dry needling to quadratus.    PT Home Exercise Plan  Access Code: [email or web address]    Consulted and Agree with Plan of Care  Patient       Patient will benefit from skilled therapeutic intervention in order to improve the following deficits and impairments:  Decreased activity tolerance, Decreased balance, Decreased strength, Decreased mobility, Decreased endurance, Decreased range of motion, Postural dysfunction, Improper body mechanics, Impaired flexibility, Pain, Increased muscle spasms  Visit Diagnosis: Muscle weakness (generalized)  Muscle spasm of back  Chronic right-sided low back pain with right-sided sciatica     Problem List Patient Active Problem List   Diagnosis Date Noted  . Dehydration 03/08/2017  . Lumbar stenosis 03/05/2017  . HNP (herniated nucleus pulposus), cervical 10/25/2016     Sigurd Sos, PT 10/13/19 9:23 AM  [facility] [address], [place], [place], [address] Phone: [phone]   Fax:  [phone]  Name:  Brian Tran MRN: [id number] Date of  Birth: 12/15/1975

## 2019-10-14 ENCOUNTER — Encounter: Payer: Self-pay | Admitting: Gastroenterology

## 2019-10-14 ENCOUNTER — Ambulatory Visit: Payer: 59 | Admitting: Gastroenterology

## 2019-10-14 ENCOUNTER — Other Ambulatory Visit: Payer: Self-pay

## 2019-10-14 VITALS — BP 122/80 | HR 99 | Temp 98.2°F | Ht 75.0 in | Wt 221.0 lb

## 2019-10-14 DIAGNOSIS — K449 Diaphragmatic hernia without obstruction or gangrene: Secondary | ICD-10-CM | POA: Diagnosis not present

## 2019-10-14 DIAGNOSIS — K219 Gastro-esophageal reflux disease without esophagitis: Secondary | ICD-10-CM

## 2019-10-14 MED ORDER — PANTOPRAZOLE SODIUM 40 MG PO TBEC: 40.0000 mg | DELAYED_RELEASE_TABLET | Freq: Two times a day (BID) | ORAL | 3 refills | Status: AC

## 2019-10-14 NOTE — Patient Instructions (Signed)
We have sent the following medications to your pharmacy for you to pick up at your convenience: Protonix  It was a pleasure to see you today!  Vito Cirigliano, D.O.

## 2019-10-14 NOTE — Progress Notes (Signed)
P  Chief Complaint:    GERD, discuss antireflux surgical options  HPI: 44 year old male referred by Dr. Havery Moros with a longstanding history of GERD, referred for evaluation of antireflux surgical, namely Transoral Incisionless Fundoplication (TIF),  with a goal to stop or significantly reduce acid suppression therapy.  He does have longstanding reflux for which she is been on a PPI for over 25 years.  Index sxs of waterbrash, regurgitation, belching, HB.  Worse with spicy foods, but more recently post prandial with any foods, typically within 1 hour of eating. Occasional nocturnal sxs (regurgitation, sour brash). Takes Tums or an extra dose of PPI for breakthrough. No dysphagia. He is mostly been on omeprazole anywhere from 20 mg to 40 mg a day.  The higher dose of omeprazole causes constipation, but more frequent breakthrough with lower dose.Recently changed to Protonix 40 mg/day without any significant improvement.  Takes 30 minutes prior to lunch each day, but continues to have breakthrough symptoms, typically in the morning after breakfast. Sxs worse when he has trialed stopping medications. Now breakthrough with any missed doses.   Had ACDF 2018 for spinal stenosis (C3-7 fusion). No issues with neck extension, with mildly reduced flexion. Follows in PT clinic.    GERD history: -Index symptoms: Regurgitation, waterbrash, belching, pyrosis -Medications trialed: omperazole, Nexium, Prevacid, AcipHex, Protonix -Current medications: Protonix 40 mg/day (increasing to bid today) -Complications: Small HH  GERD evaluation: -EGD: 08/2019.  1 cm sliding-type HH, otherwise normal -Barium esophagram: 09/2019: Tiny HH, otherwise normal.  Normal motility.  No provocative maneuvers -Esophageal Manometry: None -pH/Impedance: None  Endoscopic History: -EGD (08/2019, Dr. Havery Moros): 1 cm HH, fundic gland polyps  GERD-HRQL Questionnaire score: 34/50 (on PPI) (along with 23/30 points demonstrating  significant impairment in QOL with regurgitation)   Review of systems:     No chest pain, no SOB, no fevers, no urinary sx   Past Medical History:  Diagnosis Date  . Anxiety   . DDD (degenerative disc disease), cervical   . Fatty liver   . GERD (gastroesophageal reflux disease)   . Hyperlipidemia   . Neuromuscular disorder ([place])    right ulnar nerve pain  . Seasonal allergies     Patient's surgical history, family medical history, social history, medications and allergies were all reviewed in Epic    Current Outpatient Medications  Medication Sig Dispense Refill  . acetaminophen (TYLENOL) 500 MG tablet Take 1,000 mg by mouth every 8 (eight) hours as needed for mild pain or headache.     Marland Kitchen atorvastatin (LIPITOR) 20 MG tablet Take 1 pill three nights/week (Mon-Wed-Fri)    . gabapentin (NEURONTIN) 300 MG capsule Take 1 capsule (300 mg total) by mouth at bedtime. Start with 1 capsule at bedtime. If after 2 weeks it is well tolerated, you may increase to twice a day 30 capsule 3  . ibuprofen (ADVIL) 800 MG tablet Take 800 mg by mouth as needed.    . pantoprazole (PROTONIX) 40 MG tablet Take 1 tablet (40 mg total) by mouth daily. 30 tablet 3  . omeprazole (PRILOSEC) 20 MG capsule Take 20 mg by mouth daily.     No current facility-administered medications for this visit.    Physical Exam:     BP 122/80   Pulse 99   Temp 98.2 F (36.8 C)   Ht 6\' 3"  (1.905 m)   Wt 221 lb (100.2 kg)   BMI 27.62 kg/m   GENERAL:  Pleasant male in NAD PSYCH: : Cooperative, normal  affect SKIN:  turgor, no lesions seen Musculoskeletal:  Normal muscle tone, normal strength NEURO: Alert and oriented x 3, no focal neurologic deficits   IMPRESSION and PLAN:    1) GERD 2) Hiatal hernia Brian Tran is a 44 y.o. male referred to me to discuss GERD and treatment options. Discussed the pathophysiology of GERD at length, to include the risks of untreated reflux (ie, strictures, Barrett's Esophagus,  EAC, etc) as well as the possible treatment with medications vs antireflux surgery. In particular, we discussed the risks, benefits, and alternatives of Transoral Incisionless Fundoplication (TIF), to include Nissen fundoplication and Linx, and the patient wishes to proceed with the pre-operative evaluation for TIF. Will proceed as below:   -Recent barium esophagram did not demonstrate reflux on report, but patient reports he was told there was refluxate.  I personally discussed with Dr. Tery Sanfilippo (Radiologist) and we reviewed all images.  No refluxate noted. -Explained to the patient that despite no reflux noted on esophagram, this is just a small snapshot in time, and certainly does not rule out clinically significant reflux that would be amenable to antireflux surgery. -Plan for EGD with Bravo placement to be done off PPI x7 days -Discussed Bravo vs transnasal pH/impedance testing, and he strongly preferred Bravo - Increase Protonix to 40 mg bid for increasing breakthrough symptoms.  Take new dose 30-60 minutes before breakfast, along with continued prelunch dose -I personally reviewed images from his recent upper endoscopy.  Some subtle reflux changes noted on white light and NBI images.  Can reevaluate this area at the time of repeat EGD as above, and can always sample to look for histologic evidence of reflux as well   - Discussed the strict post-procedure diet, to include clears x24 hours then liquids x2 weeks and slow advancement to pureed, thick, then finally previous foods 6 weeks post operatively  - Discussed the activity limitations for the initial 6 weeks post operatively  - Directed patient to informative video regarding the TIF procedure at [email or web address]  - All questions answered -To follow-up with me after Bravo study complete  The indications, risks, and benefits of EGD with Bravo placement were explained to the patient in detail. Risks include but are not limited to  bleeding, perforation, adverse reaction to medications, and cardiopulmonary compromise. Sequelae include but are not limited to the possibility of surgery, hositalization, and mortality. The patient verbalized understanding and wished to proceed. All questions answered, referred to scheduler. Further recommendations pending results of the exam.      I spent 45 minutes of time, including in depth chart review, independent review of results as outlined above, communicating results with the patient directly, face-to-face time with the patient, coordinating care, ordering studies and medications as appropriate, and documentation.        Lavena Bullion ,DO, FACG 10/14/2019, 8:35 AM

## 2019-10-15 ENCOUNTER — Telehealth: Payer: Self-pay

## 2019-10-15 NOTE — Telephone Encounter (Signed)
-----   Message from Warren, DO sent at 10/14/2019 12:48 PM EST ----- Can you please let this patient know that I was able to talk to the Radiologist.  No reflux was noted during his recent barium study.  The radiologist also felt that it was not high value to repeat the study, and also instead recommended proceeding with EGD with Bravo placement.Can you please schedule for EGD with Bravo at [facility] with me?  He needs to stop taking PPI 7 days prior to procedure.  Interestingly, per an email from [facility] that just came out (after I saw the patient today) indicates that we should be able to proceed with this procedure during my yellow block in February (this is a rapid change from halting cases earlier this week).  If he is comfortable with it, can proceed with EGD with Bravo placement in February off PPI.  Let him know this is not a procedure that he has to stay overnight for.  Thank you.

## 2019-10-15 NOTE — Telephone Encounter (Signed)
Spoke to patient to inform him of recent barium study and further recommendations. Patient states that he would rather wait until March or early summer to have EGD with Bravo. He sates he does not feel safe going to the hospital at this time to have a procedure due to Covid. Patient has been placed on a list to contact and hopefully schedule for a later date.

## 2019-10-16 ENCOUNTER — Ambulatory Visit: Payer: 59

## 2019-10-21 ENCOUNTER — Other Ambulatory Visit: Payer: Self-pay

## 2019-10-21 ENCOUNTER — Ambulatory Visit: Payer: 59 | Attending: Neurosurgery

## 2019-10-21 DIAGNOSIS — M62830 Muscle spasm of back: Secondary | ICD-10-CM | POA: Diagnosis present

## 2019-10-21 DIAGNOSIS — M5441 Lumbago with sciatica, right side: Secondary | ICD-10-CM | POA: Insufficient documentation

## 2019-10-21 DIAGNOSIS — M6281 Muscle weakness (generalized): Secondary | ICD-10-CM | POA: Diagnosis present

## 2019-10-21 DIAGNOSIS — G8929 Other chronic pain: Secondary | ICD-10-CM | POA: Insufficient documentation

## 2019-10-21 NOTE — Therapy (Signed)
[facility] [address] [place], [place], [address] Phone: [phone]   Fax:  [phone]  Physical Therapy Treatment  Patient Details  Name: Brian Tran MRN: [id number] Date of Birth: Jan 01, 1976 Referring Provider (PT): Jovita Gamma, MD   Encounter Date: 10/21/2019  PT End of Session - 10/21/19 0842    Visit Number  13    Date for PT Re-Evaluation  12/02/19    Authorization Type  UHC    PT Start Time  0800    PT Stop Time  0840    PT Time Calculation (min)  40 min    Activity Tolerance  Patient tolerated treatment well    Behavior During Therapy  [facility] for tasks assessed/performed       Past Medical History:  Diagnosis Date  . Anxiety   . DDD (degenerative disc disease), cervical   . Fatty liver   . GERD (gastroesophageal reflux disease)   . Hyperlipidemia   . Neuromuscular disorder ([place])    right ulnar nerve pain  . Seasonal allergies     Past Surgical History:  Procedure Laterality Date  . ANTERIOR CERVICAL DECOMPRESSION/DISCECTOMY FUSION 4 LEVELS N/A 10/25/2016   Procedure: ANTERIOR CERVICAL DECOMPRESSION/DISCECTOMY FUSION  CERVICAL THREE-CERVICAL FOUR  CERVICAL FOUR-CERVICAL FIVE ,CERVICAL FIVE-CERVICAL SIX ,CERVICAL SIX-CERVICAL SEVEN;  Surgeon: Jovita Gamma, MD;  Location: [place];  Service: Neurosurgery;  Laterality: N/A;  . BRONCHOSCOPY    . HERNIA REPAIR Right    inguinal hernia  . WISDOM TOOTH EXTRACTION      There were no vitals filed for this visit.  Subjective Assessment - 10/21/19 0754    Subjective  I feel good for 3-4 days after needling.  I am really having a hard time sleeping.  I'm waking 2-3 times a night.    Currently in Pain?  Yes    Pain Score  6     Pain Location  Back    Pain Orientation  Left;Right;Lateral;Lower    Pain Descriptors / Indicators  Aching;Dull;Tightness    Pain Type  Chronic pain    Pain Onset  More than a month ago    Pain Frequency  Constant    Aggravating Factors   sleep    Pain Relieving Factors  stretching, dry needling, Tylenol         [facility] PT Assessment - 10/21/19 0001      Assessment   Medical Diagnosis  chronic bilateral LBP without sciatica    Referring Provider (PT)  Jovita Gamma, MD    Onset Date/Surgical Date  12/27/18      Prior Function   Level of Independence  Independent    Vocation  Part time employment    Vocation Requirements  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  Bil hips 4+/5, Lt knee 4+/5, Rt knee 5/5      Palpation   Palpation comment  trigger points in bil quadratus lumborum                    [facility] Adult PT Treatment/Exercise - 10/21/19 0001      Lumbar Exercises: Stretches   Lower Trunk Rotation  3 reps;20 seconds    Lower Trunk Rotation Limitations  legs on ball  Lumbar Exercises: Aerobic   Nustep  Level 4 x 10 minutes- legs only      Lumbar Exercises: Standing   Other Standing Lumbar Exercises  quadratus stretch using doorway 5x 10 seconds each    Other Standing Lumbar Exercises  walking in reverse 35# 2x10      Lumbar Exercises: Sidelying   Clam  Both;20 reps    Clam Limitations  blue band- difficult and reproduced Lt LE symptoms      Knee/Hip Exercises: Stretches   Active Hamstring Stretch  Both;3 reps;20 seconds       Trigger Point Dry Needling - 10/21/19 0001    Consent Given?  --    Muscles Treated Back/Hip  --    Quadratus Lumborum Response  --           PT Education - 10/21/19 0818    Education Details  Access Code: [email or web address]    Person(s) Educated  Patient    Methods  Explanation;Demonstration;Handout    Comprehension  Returned demonstration;Verbalized understanding       PT Short Term Goals - 10/21/19 0806      PT SHORT TERM GOAL #2   Title  report < or = to  4/10 LBP with sitting for work and riding in the car    Baseline  up to 7/10 in the morning.  Pain reduces to 2/10 during the day.    Status  On-going        PT Long Term Goals - 10/21/19 0830      PT LONG TERM GOAL #1   Title  be independent in advanced HEP    Time  6    Period  Weeks    Status  On-going    Target Date  12/02/19      PT LONG TERM GOAL #2   Title  reduce FOTO to < or = to 41% limitation    Baseline  47% limitation    Time  6    Period  Weeks    Status  On-going    Target Date  12/02/19      PT LONG TERM GOAL #3   Title  report a 75% reduction in LBP to allow for riding in the car for longer periods    Baseline  55-60%    Time  6    Period  Weeks    Status  On-going    Target Date  12/02/19      PT LONG TERM GOAL #4   Title  report waking < or = to 2x/night with LBP to improve quality and quantity of sleep    Baseline  waking 3x/night with 7/10 pain    Time  6    Period  Weeks    Status  On-going    Target Date  12/02/19            Plan - 10/21/19 0814    Clinical Impression Statement  Pt reports 55-60% overall improvement since the start of care.  Leg pain has now resolved. Pt continues to have 7/10 bil lumbar/quadratus pain that is worse at night.  Pt had 3-4 days of relief after dry needling and has full nights of sleep on those nights.  Pain during the day reduces to 2/10.  FOTO has improved to 47% limitation. Pt continues to perform stretching and strength exercises at home.  Pt requires verbal cues to improve technique exercises.  Pt will continue to benefit from skilled PT to address core  and hip strength, address flexibility and muscle tension to improve endurance, function and sleep at night.    PT Frequency  2x / week    PT Duration  6 weeks    PT Treatment/Interventions  ADLs/Self Care Home Management;Cryotherapy;Electrical Stimulation;Ultrasound;Moist Heat;Functional mobility training;Therapeutic activities;Therapeutic  exercise;Neuromuscular re-education;Manual techniques;Patient/family education;Passive range of motion;Dry needling;Joint Manipulations;Taping    PT Next Visit Plan  core and hip strength, neutral spine.  Assess response to dry needling to quadratus.    PT Home Exercise Plan  Access Code: [email or web address]    Consulted and Agree with Plan of Care  Patient       Patient will benefit from skilled therapeutic intervention in order to improve the following deficits and impairments:  Decreased activity tolerance, Decreased balance, Decreased strength, Decreased mobility, Decreased endurance, Decreased range of motion, Postural dysfunction, Improper body mechanics, Impaired flexibility, Pain, Increased muscle spasms  Visit Diagnosis: Muscle spasm of back - Plan: PT plan of care cert/re-cert  Muscle weakness (generalized) - Plan: PT plan of care cert/re-cert  Chronic right-sided low back pain with right-sided sciatica - Plan: PT plan of care cert/re-cert     Problem List Patient Active Problem List   Diagnosis Date Noted  . Dehydration 03/08/2017  . Lumbar stenosis 03/05/2017  . HNP (herniated nucleus pulposus), cervical 10/25/2016    Sigurd Sos, PT 10/21/19 8:44 AM   [facility] [address] [place], [place], [address] Phone: [phone]   Fax:  [phone]  Name: PERSEUS JOSWIAK MRN: [id number] Date of Birth: Aug 09, 1976

## 2019-10-21 NOTE — Patient Instructions (Signed)
Access Code: [email or web address]  URL: [email or web address]  Date: 10/21/2019  Prepared by: Sigurd Sos    Standing Quadratus Lumborum Stretch with Doorway - 5 reps - 1 sets - 10 hold - 3x daily - 7x weekly

## 2019-10-23 ENCOUNTER — Ambulatory Visit: Payer: 59

## 2019-10-23 ENCOUNTER — Other Ambulatory Visit: Payer: Self-pay

## 2019-10-23 DIAGNOSIS — M5441 Lumbago with sciatica, right side: Secondary | ICD-10-CM

## 2019-10-23 DIAGNOSIS — G8929 Other chronic pain: Secondary | ICD-10-CM

## 2019-10-23 DIAGNOSIS — M62830 Muscle spasm of back: Secondary | ICD-10-CM

## 2019-10-23 DIAGNOSIS — M6281 Muscle weakness (generalized): Secondary | ICD-10-CM

## 2019-10-23 NOTE — Therapy (Signed)
[facility] [address], [place], [place], [address] Phone: [phone]   Fax:  [phone]  Physical Therapy Treatment  Patient Details  Name: Brian Tran MRN: [id number] Date of Birth: Oct 25, 1975 Referring Provider (PT): Jovita Gamma, MD   Encounter Date: 10/23/2019  PT End of Session - 10/23/19 0836    Visit Number  14    Date for PT Re-Evaluation  12/02/19    Authorization Type  UHC    PT Start Time  0800    PT Stop Time  0840    PT Time Calculation (min)  40 min    Activity Tolerance  Patient tolerated treatment well    Behavior During Therapy  [facility] for tasks assessed/performed       Past Medical History:  Diagnosis Date  . Anxiety   . DDD (degenerative disc disease), cervical   . Fatty liver   . GERD (gastroesophageal reflux disease)   . Hyperlipidemia   . Neuromuscular disorder ([place])    right ulnar nerve pain  . Seasonal allergies     Past Surgical History:  Procedure Laterality Date  . ANTERIOR CERVICAL DECOMPRESSION/DISCECTOMY FUSION 4 LEVELS N/A 10/25/2016   Procedure: ANTERIOR CERVICAL DECOMPRESSION/DISCECTOMY FUSION  CERVICAL THREE-CERVICAL FOUR  CERVICAL FOUR-CERVICAL FIVE ,CERVICAL FIVE-CERVICAL SIX ,CERVICAL SIX-CERVICAL SEVEN;  Surgeon: Jovita Gamma, MD;  Location: [place];  Service: Neurosurgery;  Laterality: N/A;  . BRONCHOSCOPY    . HERNIA REPAIR Right    inguinal hernia  . WISDOM TOOTH EXTRACTION      There were no vitals filed for this visit.  Subjective Assessment - 10/23/19 0756    Subjective  I am feeling better this morning.  Pain is still worse at night.  The doorway stretch is really helping.    Pertinent History  cervical fusion 2018, L3-5 fusion 02/2017    Currently in Pain?  Yes    Pain Score  4     Pain Location  Back    Pain Orientation  Right;Left    Pain Descriptors / Indicators  Aching;Dull;Tightness    Pain Type  Chronic pain    Pain Onset  More than a  month ago    Pain Frequency  Constant                       [facility] Adult PT Treatment/Exercise - 10/23/19 0001      Lumbar Exercises: Stretches   Lower Trunk Rotation  3 reps;20 seconds    Lower Trunk Rotation Limitations  legs on ball      Lumbar Exercises: Aerobic   Nustep  Level 4 x 10 minutes- legs only      Lumbar Exercises: Standing   Other Standing Lumbar Exercises  quadratus stretch using doorway 5x 10 seconds each    Other Standing Lumbar Exercises  walking in reverse 35# 2x10      Lumbar Exercises: Sidelying   Clam  Both;20 reps      Manual Therapy   Manual Therapy  Soft tissue mobilization;Myofascial release    Manual therapy comments  bil quadratus in sidelying    Soft tissue mobilization  lumbar paraspinals and Rt quadratus       Trigger Point Dry Needling - 10/23/19 0001    Consent Given?  Yes    Muscles Treated Back/Hip  Quadratus lumborum    Quadratus Lumborum Response  Twitch response elicited;Palpable increased muscle length  PT Short Term Goals - 10/21/19 0806      PT SHORT TERM GOAL #2   Title  report < or = to 4/10 LBP with sitting for work and riding in the car    Baseline  up to 7/10 in the morning.  Pain reduces to 2/10 during the day.    Status  On-going        PT Long Term Goals - 10/21/19 0830      PT LONG TERM GOAL #1   Title  be independent in advanced HEP    Time  6    Period  Weeks    Status  On-going    Target Date  12/02/19      PT LONG TERM GOAL #2   Title  reduce FOTO to < or = to 41% limitation    Baseline  47% limitation    Time  6    Period  Weeks    Status  On-going    Target Date  12/02/19      PT LONG TERM GOAL #3   Title  report a 75% reduction in LBP to allow for riding in the car for longer periods    Baseline  55-60%    Time  6    Period  Weeks    Status  On-going    Target Date  12/02/19      PT LONG TERM GOAL #4   Title  report waking < or = to 2x/night with LBP to  improve quality and quantity of sleep    Baseline  waking 3x/night with 7/10 pain    Time  6    Period  Weeks    Status  On-going    Target Date  12/02/19            Plan - 10/23/19 0816    Clinical Impression Statement  Pt with reduced pain overall today.  Pt participated in exercises without increased pain today.  Pt with continued tension and trigger points in bil quadratus although overall improved since the start of care.  Pt has been using meditation to improve sleep.  Pt will continue to benefit from skilled PT to address chronic pain and flexibility.    PT Frequency  2x / week    PT Duration  6 weeks    PT Treatment/Interventions  ADLs/Self Care Home Management;Cryotherapy;Electrical Stimulation;Ultrasound;Moist Heat;Functional mobility training;Therapeutic activities;Therapeutic exercise;Neuromuscular re-education;Manual techniques;Patient/family education;Passive range of motion;Dry needling;Joint Manipulations;Taping    PT Next Visit Plan  core and hip strength, neutral spine.  Assess response to dry needling to quadratus.    PT Home Exercise Plan  Access Code: [email or web address]    Consulted and Agree with Plan of Care  Patient       Patient will benefit from skilled therapeutic intervention in order to improve the following deficits and impairments:  Decreased activity tolerance, Decreased balance, Decreased strength, Decreased mobility, Decreased endurance, Decreased range of motion, Postural dysfunction, Improper body mechanics, Impaired flexibility, Pain, Increased muscle spasms  Visit Diagnosis: Muscle spasm of back  Muscle weakness (generalized)  Chronic right-sided low back pain with right-sided sciatica     Problem List Patient Active Problem List   Diagnosis Date Noted  . Dehydration 03/08/2017  . Lumbar stenosis 03/05/2017  . HNP (herniated nucleus pulposus), cervical 10/25/2016    Sigurd Sos, PT 10/23/19 8:41 AM  [facility] [address] [place], [place], [address] Phone: [phone]   Fax:  [phone]  Name: Brian Tran MRN: [id number] Date of Birth: August 16, 1976

## 2019-10-28 ENCOUNTER — Ambulatory Visit: Payer: 59

## 2019-10-28 ENCOUNTER — Other Ambulatory Visit: Payer: Self-pay

## 2019-10-28 DIAGNOSIS — M62830 Muscle spasm of back: Secondary | ICD-10-CM

## 2019-10-28 DIAGNOSIS — M6281 Muscle weakness (generalized): Secondary | ICD-10-CM

## 2019-10-28 DIAGNOSIS — M5441 Lumbago with sciatica, right side: Secondary | ICD-10-CM

## 2019-10-28 DIAGNOSIS — G8929 Other chronic pain: Secondary | ICD-10-CM

## 2019-10-28 NOTE — Therapy (Signed)
[facility] [address], [place], [place], [address] Phone: [phone]   Fax:  [phone]  Physical Therapy Treatment  Patient Details  Name: Brian Tran MRN: [id number] Date of Birth: Mar 29, 1976 Referring Provider (Brian Tran): Jovita Gamma, MD   Encounter Date: 10/28/2019  Brian Tran End of Session - 10/28/19 0836    Visit Number  15    Date for Brian Tran Re-Evaluation  12/02/19    Authorization Type  UHC    Brian Tran Start Time  0800    Brian Tran Stop Time  0850    Brian Tran Time Calculation (min)  50 min    Activity Tolerance  Patient tolerated treatment well    Behavior During Therapy  [facility] for tasks assessed/performed       Past Medical History:  Diagnosis Date  . Anxiety   . DDD (degenerative disc disease), cervical   . Fatty liver   . GERD (gastroesophageal reflux disease)   . Hyperlipidemia   . Neuromuscular disorder ([place])    right ulnar nerve pain  . Seasonal allergies     Past Surgical History:  Procedure Laterality Date  . ANTERIOR CERVICAL DECOMPRESSION/DISCECTOMY FUSION 4 LEVELS N/A 10/25/2016   Procedure: ANTERIOR CERVICAL DECOMPRESSION/DISCECTOMY FUSION  CERVICAL THREE-CERVICAL FOUR  CERVICAL FOUR-CERVICAL FIVE ,CERVICAL FIVE-CERVICAL SIX ,CERVICAL SIX-CERVICAL SEVEN;  Surgeon: Jovita Gamma, MD;  Location: [place];  Service: Neurosurgery;  Laterality: N/A;  . BRONCHOSCOPY    . HERNIA REPAIR Right    inguinal hernia  . WISDOM TOOTH EXTRACTION      There were no vitals filed for this visit.  Subjective Assessment - 10/28/19 0804    Subjective  My pain feels lower towards my glutes now.    Patient Stated Goals  reduce LBP, sit/stand/walk longer without limitation    Currently in Pain?  Yes    Pain Score  7     Pain Location  Back    Pain Orientation  Right;Left    Pain Descriptors / Indicators  Aching;Dull;Tightness    Pain Type  Chronic pain    Pain Onset  More than a month ago    Pain Frequency  Constant    Aggravating Factors   sleep    Pain Relieving Factors  stretching, dry needling, Tylenol         [facility] Brian Tran Assessment - 10/28/19 0001      Assessment   Medical Diagnosis  chronic bilateral LBP without sciatica    Referring Provider (Brian Tran)  Jovita Gamma, MD    Onset Date/Surgical Date  12/27/18      Observation/Other Assessments   Focus on Therapeutic Outcomes (FOTO)   47% limitation      Strength   Overall Strength Comments  core strength 4+/5.  Bil hips 4+/5, Lt knee 4+/5, Rt knee 5/5      Palpation   Palpation comment  trigger points in bil quadratus lumborum and Rt gluteals                   [facility] Adult Brian Tran Treatment/Exercise - 10/28/19 0001      Lumbar Exercises: Stretches   Lower Trunk Rotation  3 reps;20 seconds    Lower Trunk Rotation Limitations  legs on ball      Lumbar Exercises: Aerobic   Nustep  Level 4 x 10 minutes- legs only      Lumbar Exercises: Standing   Other Standing Lumbar Exercises  walking in reverse 35# 2x10      Lumbar  Exercises: Seated   Other Seated Lumbar Exercises  seated on green ball: 3# weights 3 way raises with core activation 2x10    Other Seated Lumbar Exercises  press into foam roll x 10      Electrical Stimulation   Electrical Stimulation Location  lumbar spine     Electrical Stimulation Action  IFC    Electrical Stimulation Parameters  15 minutes    Electrical Stimulation Goals  Pain               Brian Tran Short Term Goals - 10/21/19 0806      Brian Tran SHORT TERM GOAL #2   Title  report < or = to 4/10 LBP with sitting for work and riding in the car    Baseline  up to 7/10 in the morning.  Pain reduces to 2/10 during the day.    Status  On-going        Brian Tran Long Term Goals - 10/28/19 0814      Brian Tran LONG TERM GOAL #1   Title  be independent in advanced HEP    Time  6    Period  Weeks    Status  On-going      Brian Tran LONG TERM GOAL #2   Title  reduce FOTO to < or = to 41% limitation    Baseline  47% limitation     Time  6    Period  Weeks    Status  On-going      Brian Tran LONG TERM GOAL #3   Title  report a 75% reduction in LBP to allow for riding in the car for longer periods    Baseline  55-60%    Time  6    Period  Weeks    Status  On-going      Brian Tran LONG TERM GOAL #4   Title  report waking < or = to 2x/night with LBP to improve quality and quantity of sleep    Baseline  waking 3x/night with 7-9/10 pain    Time  6    Period  Weeks    Status  On-going            Plan - 10/28/19 0815    Clinical Impression Statement  Brian Tran reports 55-60% overall improvement since the start of care.  Leg pain has now resolved. Brian Tran continues to have 7-9/10 bil lumbar/quadratus pain that is worse at night. Brian Tran is waking3x/night with pain. Lt LE radiculopathy has reduced by 50%.  Pain during the day reduces to 2/10.  FOTO has improved to 47% limitation. Brian Tran continues to perform stretching and strength exercises at home.  Brian Tran requires verbal cues to improve technique exercises.  Brian Tran will continue to benefit from skilled Brian Tran to address core and hip strength, address flexibility and muscle tension to improve endurance, function and sleep at night.    Brian Tran Frequency  2x / week    Brian Tran Duration  6 weeks    Brian Tran Treatment/Interventions  ADLs/Self Care Home Management;Cryotherapy;Electrical Stimulation;Ultrasound;Moist Heat;Functional mobility training;Therapeutic activities;Therapeutic exercise;Neuromuscular re-education;Manual techniques;Patient/family education;Passive range of motion;Dry needling;Joint Manipulations;Taping    Brian Tran Next Visit Plan  core and hip strength, neutral spine.    Brian Tran Home Exercise Plan  Access Code: [email or web address]    Consulted and Agree with Plan of Care  Patient       Patient will benefit from skilled therapeutic intervention in order to improve the following deficits and impairments:  Decreased activity tolerance, Decreased balance, Decreased strength, Decreased mobility,  Decreased endurance, Decreased range of  motion, Postural dysfunction, Improper body mechanics, Impaired flexibility, Pain, Increased muscle spasms  Visit Diagnosis: Muscle spasm of back  Muscle weakness (generalized)  Chronic right-sided low back pain with right-sided sciatica     Problem List Patient Active Problem List   Diagnosis Date Noted  . Dehydration 03/08/2017  . Lumbar stenosis 03/05/2017  . HNP (herniated nucleus pulposus), cervical 10/25/2016     Brian Tran, Brian Tran 10/28/19 8:39 AM  [facility] [address] [place], [place], [address] Phone: [phone]   Fax:  [phone]  Name: Brian Tran MRN: [id number] Date of Birth: 04/10/1976

## 2019-11-04 ENCOUNTER — Ambulatory Visit: Payer: 59

## 2019-11-04 ENCOUNTER — Other Ambulatory Visit: Payer: Self-pay

## 2019-11-04 DIAGNOSIS — M6281 Muscle weakness (generalized): Secondary | ICD-10-CM

## 2019-11-04 DIAGNOSIS — M62830 Muscle spasm of back: Secondary | ICD-10-CM | POA: Diagnosis not present

## 2019-11-04 DIAGNOSIS — G8929 Other chronic pain: Secondary | ICD-10-CM

## 2019-11-04 NOTE — Therapy (Signed)
[facility] [address] [place], [place], [address] Phone: [phone]   Fax:  [phone]  Physical Therapy Treatment  Patient Details  Name: Brian Tran MRN: [id number] Date of Birth: October 03, 1975 Referring Provider (PT): Jovita Gamma, MD   Encounter Date: 11/04/2019  PT End of Session - 11/04/19 1207    Visit Number  16    Date for PT Re-Evaluation  12/02/19    Authorization Type  UHC    PT Start Time  1130    PT Stop Time  1211    PT Time Calculation (min)  41 min    Activity Tolerance  Patient tolerated treatment well    Behavior During Therapy  [facility] for tasks assessed/performed       Past Medical History:  Diagnosis Date  . Anxiety   . DDD (degenerative disc disease), cervical   . Fatty liver   . GERD (gastroesophageal reflux disease)   . Hyperlipidemia   . Neuromuscular disorder ([place])    right ulnar nerve pain  . Seasonal allergies     Past Surgical History:  Procedure Laterality Date  . ANTERIOR CERVICAL DECOMPRESSION/DISCECTOMY FUSION 4 LEVELS N/A 10/25/2016   Procedure: ANTERIOR CERVICAL DECOMPRESSION/DISCECTOMY FUSION  CERVICAL THREE-CERVICAL FOUR  CERVICAL FOUR-CERVICAL FIVE ,CERVICAL FIVE-CERVICAL SIX ,CERVICAL SIX-CERVICAL SEVEN;  Surgeon: Jovita Gamma, MD;  Location: [place];  Service: Neurosurgery;  Laterality: N/A;  . BRONCHOSCOPY    . HERNIA REPAIR Right    inguinal hernia  . WISDOM TOOTH EXTRACTION      There were no vitals filed for this visit.  Subjective Assessment - 11/04/19 1131    Subjective  I saw Dr Sherwood Gambler last week- he is not concerned about the leg symptoms and wants me to keep working the muscles, getting stronger and addressing muscle tension.    Patient Stated Goals  reduce LBP, sit/stand/walk longer without limitation    Currently in Pain?  Yes                       [facility] Adult PT Treatment/Exercise - 11/04/19 0001      Lumbar Exercises:  Stretches   Lower Trunk Rotation  3 reps;20 seconds    Lower Trunk Rotation Limitations  legs on ball      Lumbar Exercises: Aerobic   Nustep  Level 4 x 10 minutes- legs only      Lumbar Exercises: Standing   Row  Strengthening;Both;20 reps;Theraband    Theraband Level (Row)  Level 4 (Blue)    Row Limitations  standing on black pad    Shoulder Extension  Strengthening;Both;20 reps;Theraband    Theraband Level (Shoulder Extension)  Level 4 (Blue)    Shoulder Extension Limitations  standing on black pad    Other Standing Lumbar Exercises  walking in reverse 35# 2x10      Lumbar Exercises: Seated   Other Seated Lumbar Exercises  seated on green ball: 3# weights 3 way raises with core activation 2x10    Other Seated Lumbar Exercises  press into foam roll x 10      Lumbar Exercises: Supine   Other Supine Lumbar Exercises  seated: ball roll outs forward and diagonal each side x 10                PT Short Term Goals - 10/21/19 0806      PT SHORT TERM GOAL #2   Title  report < or = to 4/10 LBP  with sitting for work and riding in the car    Baseline  up to 7/10 in the morning.  Pain reduces to 2/10 during the day.    Status  On-going        PT Long Term Goals - 10/28/19 0814      PT LONG TERM GOAL #1   Title  be independent in advanced HEP    Time  6    Period  Weeks    Status  On-going      PT LONG TERM GOAL #2   Title  reduce FOTO to < or = to 41% limitation    Baseline  47% limitation    Time  6    Period  Weeks    Status  On-going      PT LONG TERM GOAL #3   Title  report a 75% reduction in LBP to allow for riding in the car for longer periods    Baseline  55-60%    Time  6    Period  Weeks    Status  On-going      PT LONG TERM GOAL #4   Title  report waking < or = to 2x/night with LBP to improve quality and quantity of sleep    Baseline  waking 3x/night with 7-9/10 pain    Time  6    Period  Weeks    Status  On-going            Plan -  11/04/19 1139    Clinical Impression Statement  Pt reports 55-60% overall improvement since the start of care.  MD would like pt to continue with strength and flexibility progression. Pt is waking 2-3x/night with pain.  Lt LE radiculopathy has reduced by 50%.  Pt arrives today with significant pain reduction.  FOTO has improved to 47% limitation.  Pt continues to perform stretching and strength exercises at home.  Pt requires verbal cues to improve technique exercises to reduce substitution.  Pt will continue to benefit from skilled PT to address core and hip strength, address flexibility and muscle tension to improve endurance, function and sleep at night.    PT Frequency  2x / week    PT Duration  6 weeks    PT Treatment/Interventions  ADLs/Self Care Home Management;Cryotherapy;Electrical Stimulation;Ultrasound;Moist Heat;Functional mobility training;Therapeutic activities;Therapeutic exercise;Neuromuscular re-education;Manual techniques;Patient/family education;Passive range of motion;Dry needling;Joint Manipulations;Taping    PT Next Visit Plan  core and hip strength, neutral spine.  Flexibility and manual to address tissue mobility and trigger points.    PT Home Exercise Plan  Access Code: [email or web address]    Consulted and Agree with Plan of Care  Patient       Patient will benefit from skilled therapeutic intervention in order to improve the following deficits and impairments:  Decreased activity tolerance, Decreased balance, Decreased strength, Decreased mobility, Decreased endurance, Decreased range of motion, Postural dysfunction, Improper body mechanics, Impaired flexibility, Pain, Increased muscle spasms  Visit Diagnosis: Muscle spasm of back  Muscle weakness (generalized)  Chronic right-sided low back pain with right-sided sciatica     Problem List Patient Active Problem List   Diagnosis Date Noted  . Dehydration 03/08/2017  . Lumbar stenosis 03/05/2017  . HNP (herniated nucleus  pulposus), cervical 10/25/2016     Sigurd Sos, PT 11/04/19 12:18 PM  [facility] [address] [place], [place], [address] Phone: [phone]   Fax:  [phone]  Name: Brian Tran MRN:  [id number] Date of Birth: [birth year]

## 2019-11-06 ENCOUNTER — Ambulatory Visit: Payer: 59

## 2019-11-10 ENCOUNTER — Ambulatory Visit: Payer: 59

## 2019-11-10 ENCOUNTER — Other Ambulatory Visit: Payer: Self-pay

## 2019-11-10 DIAGNOSIS — M62830 Muscle spasm of back: Secondary | ICD-10-CM | POA: Diagnosis not present

## 2019-11-10 DIAGNOSIS — G8929 Other chronic pain: Secondary | ICD-10-CM

## 2019-11-10 DIAGNOSIS — M6281 Muscle weakness (generalized): Secondary | ICD-10-CM

## 2019-11-10 DIAGNOSIS — M5441 Lumbago with sciatica, right side: Secondary | ICD-10-CM

## 2019-11-10 NOTE — Therapy (Signed)
[facility] [address] [place], [place], [address] Phone: [phone]   Fax:  [phone]  Physical Therapy Treatment  Patient Details  Name: Brian Tran MRN: [id number] Date of Birth: Sep 24, 1975 Referring Provider (PT): Jovita Gamma, MD   Encounter Date: 11/10/2019  PT End of Session - 11/10/19 0833    Visit Number  17    Date for PT Re-Evaluation  12/02/19    Authorization Type  UHC    PT Start Time  0755   slow mobility   PT Stop Time  0839    PT Time Calculation (min)  44 min    Activity Tolerance  Patient tolerated treatment well    Behavior During Therapy  [facility] for tasks assessed/performed       Past Medical History:  Diagnosis Date  . Anxiety   . DDD (degenerative disc disease), cervical   . Fatty liver   . GERD (gastroesophageal reflux disease)   . Hyperlipidemia   . Neuromuscular disorder ([place])    right ulnar nerve pain  . Seasonal allergies     Past Surgical History:  Procedure Laterality Date  . ANTERIOR CERVICAL DECOMPRESSION/DISCECTOMY FUSION 4 LEVELS N/A 10/25/2016   Procedure: ANTERIOR CERVICAL DECOMPRESSION/DISCECTOMY FUSION  CERVICAL THREE-CERVICAL FOUR  CERVICAL FOUR-CERVICAL FIVE ,CERVICAL FIVE-CERVICAL SIX ,CERVICAL SIX-CERVICAL SEVEN;  Surgeon: Jovita Gamma, MD;  Location: [place];  Service: Neurosurgery;  Laterality: N/A;  . BRONCHOSCOPY    . HERNIA REPAIR Right    inguinal hernia  . WISDOM TOOTH EXTRACTION      There were no vitals filed for this visit.  Subjective Assessment - 11/10/19 0753    Subjective  My Lt neck is really killing me- this has been going on since 11/05/2019.  I haven't called the MD because I thought we could treat it here.  I am not sleeping well.    Patient Stated Goals  reduce LBP, sit/stand/walk longer without limitation.    Currently in Pain?  Yes   Lt neck 9/10   Pain Score  8     Pain Location  Back    Pain Orientation  Right    Pain  Descriptors / Indicators  Aching;Dull;Tightness    Pain Type  Chronic pain    Pain Onset  More than a month ago    Pain Frequency  Constant    Aggravating Factors   sleep, too much activity    Pain Relieving Factors  stretching, dry needling, Tylenol                       [facility] Adult PT Treatment/Exercise - 11/10/19 0001      Lumbar Exercises: Stretches   Lower Trunk Rotation  3 reps;20 seconds      Lumbar Exercises: Aerobic   Nustep  Level 4 x 10 minutes- legs only      Modalities   Modalities  Electrical Stimulation;Moist Heat      Moist Heat Therapy   Number Minutes Moist Heat  20 Minutes    Moist Heat Location  Lumbar Spine      Electrical Stimulation   Electrical Stimulation Location  lumbar spine     Electrical Stimulation Action  IFC    Electrical Stimulation Parameters  20 minutes    Electrical Stimulation Goals  Pain               PT Short Term Goals - 10/21/19 [id number]      PT  SHORT TERM GOAL #2   Title  report < or = to 4/10 LBP with sitting for work and riding in the car    Baseline  up to 7/10 in the morning.  Pain reduces to 2/10 during the day.    Status  On-going        PT Long Term Goals - 10/28/19 0814      PT LONG TERM GOAL #1   Title  be independent in advanced HEP    Time  6    Period  Weeks    Status  On-going      PT LONG TERM GOAL #2   Title  reduce FOTO to < or = to 41% limitation    Baseline  47% limitation    Time  6    Period  Weeks    Status  On-going      PT LONG TERM GOAL #3   Title  report a 75% reduction in LBP to allow for riding in the car for longer periods    Baseline  55-60%    Time  6    Period  Weeks    Status  On-going      PT LONG TERM GOAL #4   Title  report waking < or = to 2x/night with LBP to improve quality and quantity of sleep    Baseline  waking 3x/night with 7-9/10 pain    Time  6    Period  Weeks    Status  On-going            Plan - 11/10/19 0831    Clinical  Impression Statement  Pt arrived today with significant Lt sided neck pain and Lt UE radiculopathy. Pt was not able to find a position of comfort due to this pain.  Pt was limited in a the ability to exercise today.  All positions of the head and neck increased pain.  Pt will contact MD to discuss new onset of neck and Lt UE symptoms.  Pt will benefit from continued PT to address chronic LBP and functional strength deficits.    PT Frequency  2x / week    PT Duration  6 weeks    PT Treatment/Interventions  ADLs/Self Care Home Management;Cryotherapy;Electrical Stimulation;Ultrasound;Moist Heat;Functional mobility training;Therapeutic activities;Therapeutic exercise;Neuromuscular re-education;Manual techniques;Patient/family education;Passive range of motion;Dry needling;Joint Manipulations;Taping    PT Next Visit Plan  core and hip strength, neutral spine.  Flexibility and manual to address tissue mobility and trigger points.    PT Home Exercise Plan  Access Code: [email or web address]    Consulted and Agree with Plan of Care  Patient       Patient will benefit from skilled therapeutic intervention in order to improve the following deficits and impairments:  Decreased activity tolerance, Decreased balance, Decreased strength, Decreased mobility, Decreased endurance, Decreased range of motion, Postural dysfunction, Improper body mechanics, Impaired flexibility, Pain, Increased muscle spasms  Visit Diagnosis: Muscle spasm of back  Muscle weakness (generalized)  Chronic right-sided low back pain with right-sided sciatica     Problem List Patient Active Problem List   Diagnosis Date Noted  . Dehydration 03/08/2017  . Lumbar stenosis 03/05/2017  . HNP (herniated nucleus pulposus), cervical 10/25/2016    Sigurd Sos, PT 11/10/19 8:37 AM  [facility] [address], [place], [place], [address] Phone: [phone]   Fax:  [phone]  Name:  Brian Tran MRN: [id number] Date of Birth: Dec 27, 1975

## 2019-11-13 ENCOUNTER — Other Ambulatory Visit: Payer: Self-pay

## 2019-11-13 ENCOUNTER — Ambulatory Visit: Payer: 59

## 2019-11-13 DIAGNOSIS — M62830 Muscle spasm of back: Secondary | ICD-10-CM

## 2019-11-13 DIAGNOSIS — G8929 Other chronic pain: Secondary | ICD-10-CM

## 2019-11-13 DIAGNOSIS — M6281 Muscle weakness (generalized): Secondary | ICD-10-CM

## 2019-11-13 NOTE — Therapy (Signed)
[facility] [address], [place], [place], [address] Phone: [phone]   Fax:  [phone]  Physical Therapy Treatment  Patient Details  Name: Brian Tran MRN: [id number] Date of Birth: 05/15/1976 Referring Provider (PT): Jovita Gamma, MD   Encounter Date: 11/13/2019  PT End of Session - 11/13/19 0928    Visit Number  18    Date for PT Re-Evaluation  12/02/19    Authorization Type  UHC    PT Start Time  0843    PT Stop Time  0928    PT Time Calculation (min)  45 min    Activity Tolerance  Patient tolerated treatment well    Behavior During Therapy  [facility] for tasks assessed/performed       Past Medical History:  Diagnosis Date  . Anxiety   . DDD (degenerative disc disease), cervical   . Fatty liver   . GERD (gastroesophageal reflux disease)   . Hyperlipidemia   . Neuromuscular disorder ([place])    right ulnar nerve pain  . Seasonal allergies     Past Surgical History:  Procedure Laterality Date  . ANTERIOR CERVICAL DECOMPRESSION/DISCECTOMY FUSION 4 LEVELS N/A 10/25/2016   Procedure: ANTERIOR CERVICAL DECOMPRESSION/DISCECTOMY FUSION  CERVICAL THREE-CERVICAL FOUR  CERVICAL FOUR-CERVICAL FIVE ,CERVICAL FIVE-CERVICAL SIX ,CERVICAL SIX-CERVICAL SEVEN;  Surgeon: Jovita Gamma, MD;  Location: [place];  Service: Neurosurgery;  Laterality: N/A;  . BRONCHOSCOPY    . HERNIA REPAIR Right    inguinal hernia  . WISDOM TOOTH EXTRACTION      There were no vitals filed for this visit.  Subjective Assessment - 11/13/19 0854    Subjective  I contacted the MD to discuss onset of neck pain.  I am having Lt LE radiculopathy and it is worse now that it has been.    Patient Stated Goals  reduce LBP, sit/stand/walk longer without limitation.    Currently in Pain?  Yes    Pain Score  7     Pain Location  Back    Pain Orientation  Left;Right    Pain Descriptors / Indicators  Shooting;Tingling    Pain Type  Chronic pain    Pain Radiating Towards  pins and needles on the Lt LE    Pain Onset  More than a month ago                       [facility] Adult PT Treatment/Exercise - 11/13/19 0001      Lumbar Exercises: Stretches   Lower Trunk Rotation  3 reps;20 seconds    Piriformis Stretch  1 rep;30 seconds;Left      Lumbar Exercises: Aerobic   Nustep  Level 4 x 10 minutes- legs only   PT present to discuss progress     Lumbar Exercises: Supine   Ab Set  20 reps;5 seconds    AB Set Limitations  ball squeeze 5" hold with ab set    Other Supine Lumbar Exercises  Lt nerve flossing x10      Knee/Hip Exercises: Standing   SLS  Lt only: emphasis on level pelvis      Manual Therapy   Manual Therapy  Soft tissue mobilization;Myofascial release    Manual therapy comments  Addaday to Lt gluteals and lumbar paraspinals     Soft tissue mobilization  long axis hip mobs               PT Short Term Goals - 10/21/19 [id number]  PT SHORT TERM GOAL #2   Title  report < or = to 4/10 LBP with sitting for work and riding in the car    Baseline  up to 7/10 in the morning.  Pain reduces to 2/10 during the day.    Status  On-going        PT Long Term Goals - 10/28/19 0814      PT LONG TERM GOAL #1   Title  be independent in advanced HEP    Time  6    Period  Weeks    Status  On-going      PT LONG TERM GOAL #2   Title  reduce FOTO to < or = to 41% limitation    Baseline  47% limitation    Time  6    Period  Weeks    Status  On-going      PT LONG TERM GOAL #3   Title  report a 75% reduction in LBP to allow for riding in the car for longer periods    Baseline  55-60%    Time  6    Period  Weeks    Status  On-going      PT LONG TERM GOAL #4   Title  report waking < or = to 2x/night with LBP to improve quality and quantity of sleep    Baseline  waking 3x/night with 7-9/10 pain    Time  6    Period  Weeks    Status  On-going            Plan - 11/13/19 [id number]    Clinical Impression  Statement  Pt arrived today with Lt LE radiculopathy that has worsened over the past 2 days.  Pt has contacted the MD regarding this pain.  Pt attempted gentle and mid range nerve flossing and this increased symptoms.  Pt with increased LE radiculopathy with all position changes and PT attempted multiple positions and activities without relief.  Pt had moderate tolerance to Addaday to the gluteals and Lt lumbar paraspinals.  Pt with increased tension in Lt gluteals and quadratus with manual therapy. No change in symptoms with Lt hip mobs and long axis pump and prolonged hold.  PT advised pt to stay in neutral posture, work on Lt single limb stance for stability and try to stretch gluteals as able.    PT Frequency  2x / week    PT Duration  6 weeks    PT Treatment/Interventions  ADLs/Self Care Home Management;Cryotherapy;Electrical Stimulation;Ultrasound;Moist Heat;Functional mobility training;Therapeutic activities;Therapeutic exercise;Neuromuscular re-education;Manual techniques;Patient/family education;Passive range of motion;Dry needling;Joint Manipulations;Taping    PT Next Visit Plan  core and hip strength, neutral spine.  Flexibility and manual to address tissue mobility and trigger points.    PT Home Exercise Plan  Access Code: [email or web address]    Consulted and Agree with Plan of Care  Patient       Patient will benefit from skilled therapeutic intervention in order to improve the following deficits and impairments:  Decreased activity tolerance, Decreased balance, Decreased strength, Decreased mobility, Decreased endurance, Decreased range of motion, Postural dysfunction, Improper body mechanics, Impaired flexibility, Pain, Increased muscle spasms  Visit Diagnosis: Muscle spasm of back  Muscle weakness (generalized)  Chronic right-sided low back pain with right-sided sciatica     Problem List Patient Active Problem List   Diagnosis Date Noted  . Dehydration 03/08/2017  . Lumbar stenosis  03/05/2017  . HNP (herniated nucleus pulposus), cervical 10/25/2016  Sigurd Sos, PT 11/13/19 9:34 AM  [facility] [address], [place], [place], [address] Phone: [phone]   Fax:  [phone]  Name: TRENIDAD GRIZZEL MRN: [id number] Date of Birth: Oct 09, 1975

## 2019-11-17 ENCOUNTER — Other Ambulatory Visit: Payer: Self-pay

## 2019-11-17 ENCOUNTER — Ambulatory Visit: Payer: 59 | Attending: Neurosurgery

## 2019-11-17 DIAGNOSIS — M5441 Lumbago with sciatica, right side: Secondary | ICD-10-CM | POA: Insufficient documentation

## 2019-11-17 DIAGNOSIS — M62830 Muscle spasm of back: Secondary | ICD-10-CM | POA: Diagnosis present

## 2019-11-17 DIAGNOSIS — M542 Cervicalgia: Secondary | ICD-10-CM | POA: Insufficient documentation

## 2019-11-17 DIAGNOSIS — M6281 Muscle weakness (generalized): Secondary | ICD-10-CM | POA: Diagnosis not present

## 2019-11-17 DIAGNOSIS — G8929 Other chronic pain: Secondary | ICD-10-CM | POA: Diagnosis present

## 2019-11-17 DIAGNOSIS — R293 Abnormal posture: Secondary | ICD-10-CM | POA: Insufficient documentation

## 2019-11-17 NOTE — Therapy (Signed)
[facility] [address], [place], [place], [address] Phone: [phone]   Fax:  [phone]  Physical Therapy Evaluation/Re-evaluation  Patient Details  Name: Brian Tran MRN: [id number] Date of Birth: [birth year] Referring Provider (PT): Jovita Gamma, MD   Encounter Date: 11/17/2019  PT End of Session - 11/17/19 0844    Visit Number  19    Date for PT Re-Evaluation  12/29/19    Authorization Type  UHC    PT Start Time  0755    PT Stop Time  0840    PT Time Calculation (min)  45 min    Activity Tolerance  Patient tolerated treatment well    Behavior During Therapy  [facility] for tasks assessed/performed       Past Medical History:  Diagnosis Date  . Anxiety   . DDD (degenerative disc disease), cervical   . Fatty liver   . GERD (gastroesophageal reflux disease)   . Hyperlipidemia   . Neuromuscular disorder ([place])    right ulnar nerve pain  . Seasonal allergies     Past Surgical History:  Procedure Laterality Date  . ANTERIOR CERVICAL DECOMPRESSION/DISCECTOMY FUSION 4 LEVELS N/A 10/25/2016   Procedure: ANTERIOR CERVICAL DECOMPRESSION/DISCECTOMY FUSION  CERVICAL THREE-CERVICAL FOUR  CERVICAL FOUR-CERVICAL FIVE ,CERVICAL FIVE-CERVICAL SIX ,CERVICAL SIX-CERVICAL SEVEN;  Surgeon: Jovita Gamma, MD;  Location: [place];  Service: Neurosurgery;  Laterality: N/A;  . BRONCHOSCOPY    . HERNIA REPAIR Right    inguinal hernia  . WISDOM TOOTH EXTRACTION      There were no vitals filed for this visit.   Subjective Assessment - 11/17/19 0754    Subjective  MD sent order for neck.  It is excruciating.    Pertinent History  cervical fusion 2018 4-5 levels, L3-5 fusion 02/2017    Diagnostic tests  x-ray: healed fusion, cervical fusion not healing    Patient Stated Goals  reduce LBP, sit/stand/walk longer without limitation.    Currently in Pain?  Yes    Pain Score  5     Pain Location  Back    Pain Orientation   Right;Left    Pain Descriptors / Indicators  Shooting    Pain Onset  More than a month ago    Pain Frequency  Constant    Aggravating Factors   it is situational and I have Lt LE radiculopathy>Rt    Pain Relieving Factors  stretching, dry needling, Tylenol    Multiple Pain Sites  Yes    Pain Score  7    Pain Location  Neck    Pain Orientation  Right;Left    Pain Descriptors / Indicators  Aching;Spasm    Pain Type  Chronic pain    Pain Onset  More than a month ago    Pain Frequency  Constant    Aggravating Factors   positions vary    Pain Relieving Factors  relaxing, elevating arms         [facility] PT Assessment - 11/17/19 0001      Assessment   Medical Diagnosis  chronic bilateral LBP without sciatica, cervicalgia    Referring Provider (PT)  Jovita Gamma, MD    Onset Date/Surgical Date  12/27/18   neck 10/29/19     Restrictions   Weight Bearing Restrictions  No      Home Environment   Living Environment  Private residence      Prior Function   Level of Independence  Independent  Vocation  Part time employment    Vocation Requirements  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, yardwork    Leisure  bike riding      Cognition   Overall Cognitive Status  Within Functional Limits for tasks assessed      Posture/Postural Control   Posture/Postural Control  Postural limitations    Postural Limitations  Flexed trunk;Decreased lumbar lordosis;Forward head;Rounded Shoulders      ROM / Strength   AROM / PROM / Strength  AROM      AROM   Overall AROM   Deficits    Overall AROM Comments  Lumbar A/ROM limited in all directions with reduced segmental movement due to multilevel lumbar fusion    AROM Assessment Site  Cervical    Cervical Flexion  20    Cervical Extension  40    Cervical - Right Side Bend  25    Cervical - Left Side Bend  25    Cervical - Right Rotation  40    Cervical - Left Rotation  45      Strength   Overall Strength  Deficits    Overall Strength Comments  core  strength 4+/5.  Bil hips 4+/5, Lt knee 4+/5, Rt knee 5/5.  Bil shoulder 5/5 except Lt shoulder flexion 4/5, Rt shoulder flexion 4+/5      Strength Assessment Site  Hand    Right/Left hand  Right;Left    Right Hand Grip (lbs)  58    Left Hand Grip (lbs)  61      Palpation   Palpation comment  trigger points in bil quadratus lumborum and Rt gluteals, bil cervical paraspinals, Lt>Rt upper trap trigger points and reduced PA mobility T1-5                Objective measurements completed on examination: See above findings.      [facility] PT Treatment/Exercise - 11/17/19 0001      Exercises   Exercises  Neck      Lumbar Exercises: Aerobic   Nustep  Level 4 x 10 minutes- legs only   PT present to discuss progress     Manual Therapy   Manual Therapy  Soft tissue mobilization;Myofascial release    Manual therapy comments  bil upper traps       Neck Exercises: Stretches   Other Neck Stretches  sidebending, rotation and flexion- gentle with emphasis on scpaular depression.       Trigger Point Dry Needling - 11/17/19 0001    Consent Given?  Yes    Education Handout Provided  Previously provided    Muscles Treated Head and Neck  Cervical multifidi;Upper trapezius    Other Dry Needling  T1-3 multifidi    Upper Trapezius Response  Twitch reponse elicited;Palpable increased muscle length    Cervical multifidi Response  Twitch reponse elicited;Palpable increased muscle length   C6-7            PT Short Term Goals - 10/21/19 0806      PT SHORT TERM GOAL #2   Title  report < or = to 4/10 LBP with sitting for work and riding in the car    Baseline  up to 7/10 in the morning.  Pain reduces to 2/10 during the day.    Status  On-going        PT Long Term Goals - 11/17/19 0806      PT LONG TERM GOAL #1   Title  be independent in advanced  HEP    Time  6    Period  Weeks    Status  On-going    Target Date  12/29/19      PT LONG TERM GOAL #2   Title  reduce FOTO to  < or = to 41% limitation (lumbar)    Baseline  47% limitation    Time  6    Period  Weeks    Status  On-going    Target Date  12/29/19      PT LONG TERM GOAL #3   Title  report a 75% reduction in LBP to allow for riding in the car for longer periods    Baseline  55-60%    Time  6    Period  Weeks    Status  On-going    Target Date  12/29/19      PT LONG TERM GOAL #4   Title  report waking < or = to 2x/night with LBP and cervical pain to improve quality and quantity of sleep    Baseline  waking 3x/night with 7-9/10 pain    Time  6    Period  Weeks    Status  On-going    Target Date  12/29/19      PT LONG TERM GOAL #5   Title  report a 60% reduction in neck pain with daily tasks    Time  6    Period  Weeks    Status  New    Target Date  12/29/19      Additional Long Term Goals   Additional Long Term Goals  Yes      PT LONG TERM GOAL #6   Title  demonstrate cervical rotation to 60 degrees bil to improve safety with driving    Time  6    Period  Weeks    Status  New    Target Date  12/29/19             Plan - 11/17/19 0842    Clinical Impression Statement  Pt ordered PT for cervical spine.  Pt with 3 week history of flare-up of neck pain.  Pt had multilevel cervical fusion 09/2016.  Pt reports constant 7/10 bil neck pain that is unchanged overall.  Pt demonstrates limited cervical A/ROM, weakness in Lt shoulder flexion, tension and trigger points in bil upper traps.  Pt with poor seated posture with forward head, rounded shoulder and scapular elevation.  Pt will benefit from skilled PT to address onset of cervical pain to improve sleep and function.    Examination-Activity Limitations  Bed Mobility;Bend;Caring for Others;Dressing;Sleep;Stand;Transfers;Locomotion Level    Stability/Clinical Decision Making  Evolving/Moderate complexity    Clinical Decision Making  Moderate    Rehab Potential  Good    PT Frequency  2x / week    PT Duration  6 weeks    PT  Treatment/Interventions  ADLs/Self Care Home Management;Cryotherapy;Electrical Stimulation;Ultrasound;Moist Heat;Functional mobility training;Therapeutic activities;Therapeutic exercise;Neuromuscular re-education;Manual techniques;Patient/family education;Passive range of motion;Dry needling;Joint Manipulations;Taping    PT Next Visit Plan  core and hip strength, neutral spine.  Flexibility and manual to address tissue mobility and trigger points.  Review cervical A/ROM, work on relaxed shoulders    PT Home Exercise Plan  Access Code: [email or web address]    Recommended Other Services  eval/recert sent [id number]    Consulted and Agree with Plan of Care  Patient       Patient will benefit from skilled therapeutic intervention in order to improve the  following deficits and impairments:  Decreased activity tolerance, Decreased balance, Decreased strength, Decreased mobility, Decreased endurance, Decreased range of motion, Postural dysfunction, Improper body mechanics, Impaired flexibility, Pain, Increased muscle spasms, Hypomobility  Visit Diagnosis: Muscle weakness (generalized) - Plan: PT plan of care cert/re-cert  Muscle spasm of back - Plan: PT plan of care cert/re-cert  Chronic right-sided low back pain with right-sided sciatica - Plan: PT plan of care cert/re-cert  Cervicalgia - Plan: PT plan of care cert/re-cert  Abnormal posture - Plan: PT plan of care cert/re-cert     Problem List Patient Active Problem List   Diagnosis Date Noted  . Dehydration 03/08/2017  . Lumbar stenosis 03/05/2017  . HNP (herniated nucleus pulposus), cervical 10/25/2016    Brian Tran, PT 11/17/19 8:47 AM  [facility] [address] [place], [place], [address] Phone: [phone]   Fax:  [phone]  Name: Brian Tran MRN: [id number] Date of Birth: 09-15-1976

## 2019-11-20 ENCOUNTER — Ambulatory Visit: Payer: 59

## 2019-11-20 ENCOUNTER — Other Ambulatory Visit: Payer: Self-pay

## 2019-11-20 DIAGNOSIS — M6281 Muscle weakness (generalized): Secondary | ICD-10-CM | POA: Diagnosis not present

## 2019-11-20 DIAGNOSIS — G8929 Other chronic pain: Secondary | ICD-10-CM

## 2019-11-20 DIAGNOSIS — M5441 Lumbago with sciatica, right side: Secondary | ICD-10-CM

## 2019-11-20 DIAGNOSIS — M542 Cervicalgia: Secondary | ICD-10-CM

## 2019-11-20 DIAGNOSIS — R293 Abnormal posture: Secondary | ICD-10-CM

## 2019-11-20 DIAGNOSIS — M62830 Muscle spasm of back: Secondary | ICD-10-CM

## 2019-11-20 NOTE — Therapy (Signed)
[facility] [address] [place], [place], [address] Phone: [phone]   Fax:  [phone]  Physical Therapy Treatment  Patient Details  Name: Brian Tran MRN: [id number] Date of Birth: 21-Feb-1976 Referring Provider (PT): Jovita Gamma, MD   Encounter Date: 11/20/2019  PT End of Session - 11/20/19 0838    Visit Number  20    Date for PT Re-Evaluation  12/29/19    Authorization Type  UHC    PT Start Time  0801    PT Stop Time  0841    PT Time Calculation (min)  40 min    Activity Tolerance  Patient tolerated treatment well    Behavior During Therapy  [facility] for tasks assessed/performed       Past Medical History:  Diagnosis Date  . Anxiety   . DDD (degenerative disc disease), cervical   . Fatty liver   . GERD (gastroesophageal reflux disease)   . Hyperlipidemia   . Neuromuscular disorder ([place])    right ulnar nerve pain  . Seasonal allergies     Past Surgical History:  Procedure Laterality Date  . ANTERIOR CERVICAL DECOMPRESSION/DISCECTOMY FUSION 4 LEVELS N/A 10/25/2016   Procedure: ANTERIOR CERVICAL DECOMPRESSION/DISCECTOMY FUSION  CERVICAL THREE-CERVICAL FOUR  CERVICAL FOUR-CERVICAL FIVE ,CERVICAL FIVE-CERVICAL SIX ,CERVICAL SIX-CERVICAL SEVEN;  Surgeon: Jovita Gamma, MD;  Location: [place];  Service: Neurosurgery;  Laterality: N/A;  . BRONCHOSCOPY    . HERNIA REPAIR Right    inguinal hernia  . WISDOM TOOTH EXTRACTION      There were no vitals filed for this visit.  Subjective Assessment - 11/20/19 0801    Subjective  No change in neck pain after needling.    Pertinent History  cervical fusion 2018 4-5 levels, L3-5 fusion 02/2017    Currently in Pain?  Yes    Pain Score  6     Pain Location  Back    Pain Score  7    Pain Location  Neck                       [facility] Adult PT Treatment/Exercise - 11/20/19 0001      Ambulation/Gait   Ambulation/Gait  Yes    Gait Comments  work  on gait with trunk rotation and alternaing arm swing 5x25 feet      Neck Exercises: Supine   Other Supine Exercise  horizontal abduction and ER with red band: ab bracing 2x10 each      Lumbar Exercises: Stretches   Active Hamstring Stretch  Right;Left;20 seconds;2 reps      Lumbar Exercises: Aerobic   Nustep  Level 4 x 10 minutes- arms and legs   PT present to discuss progress     Manual Therapy   Manual Therapy  --      Neck Exercises: Stretches   Other Neck Stretches  sidebending, rotation and flexion- gentle with emphasis on scpaular depression.               PT Short Term Goals - 10/21/19 0806      PT SHORT TERM GOAL #2   Title  report < or = to 4/10 LBP with sitting for work and riding in the car    Baseline  up to 7/10 in the morning.  Pain reduces to 2/10 during the day.    Status  On-going        PT Long Term Goals - 11/17/19 [id number]  PT LONG TERM GOAL #1   Title  be independent in advanced HEP    Time  6    Period  Weeks    Status  On-going    Target Date  12/29/19      PT LONG TERM GOAL #2   Title  reduce FOTO to < or = to 41% limitation (lumbar)    Baseline  47% limitation    Time  6    Period  Weeks    Status  On-going    Target Date  12/29/19      PT LONG TERM GOAL #3   Title  report a 75% reduction in LBP to allow for riding in the car for longer periods    Baseline  55-60%    Time  6    Period  Weeks    Status  On-going    Target Date  12/29/19      PT LONG TERM GOAL #4   Title  report waking < or = to 2x/night with LBP and cervical pain to improve quality and quantity of sleep    Baseline  waking 3x/night with 7-9/10 pain    Time  6    Period  Weeks    Status  On-going    Target Date  12/29/19      PT LONG TERM GOAL #5   Title  report a 60% reduction in neck pain with daily tasks    Time  6    Period  Weeks    Status  New    Target Date  12/29/19      Additional Long Term Goals   Additional Long Term Goals  Yes      PT  LONG TERM GOAL #6   Title  demonstrate cervical rotation to 60 degrees bil to improve safety with driving    Time  6    Period  Weeks    Status  New    Target Date  12/29/19            Plan - 11/20/19 0813    Clinical Impression Statement  Pt denies any change in pain levels after dry needling last session.  Pt is not sleeping well and is waking frequently with pain and position changes.  Session focused on gentle flexibility and strength to improve alignment.  Pt demonstrates compensation with movement due to pain.  PT provided verbal cues for technique and modifications for pain.  Pt will continue to benefit from skilled PT to address chronic pain.    PT Frequency  2x / week    PT Duration  6 weeks    PT Treatment/Interventions  ADLs/Self Care Home Management;Cryotherapy;Electrical Stimulation;Ultrasound;Moist Heat;Functional mobility training;Therapeutic activities;Therapeutic exercise;Neuromuscular re-education;Manual techniques;Patient/family education;Passive range of motion;Dry needling;Joint Manipulations;Taping    PT Next Visit Plan  core and hip strength, neutral spine.  Flexibility and manual to address tissue mobility and trigger points.  Review cervical A/ROM, work on relaxed shoulders    PT Home Exercise Plan  Access Code: [email or web address]    Consulted and Agree with Plan of Care  Patient       Patient will benefit from skilled therapeutic intervention in order to improve the following deficits and impairments:  Decreased activity tolerance, Decreased balance, Decreased strength, Decreased mobility, Decreased endurance, Decreased range of motion, Postural dysfunction, Improper body mechanics, Impaired flexibility, Pain, Increased muscle spasms, Hypomobility  Visit Diagnosis: Muscle weakness (generalized)  Muscle spasm of back  Chronic right-sided low back pain with  right-sided sciatica  Cervicalgia  Abnormal posture     Problem List Patient Active Problem List    Diagnosis Date Noted  . Dehydration 03/08/2017  . Lumbar stenosis 03/05/2017  . HNP (herniated nucleus pulposus), cervical 10/25/2016     Brian Tran, PT 11/20/19 8:40 AM  [facility] [address], [place], [place], [address] Phone: [phone]   Fax:  [phone]  Name: Brian Tran MRN: [id number] Date of Birth: 10/25/1975

## 2019-11-25 ENCOUNTER — Ambulatory Visit: Payer: 59

## 2019-11-25 ENCOUNTER — Other Ambulatory Visit: Payer: Self-pay

## 2019-11-25 DIAGNOSIS — M6281 Muscle weakness (generalized): Secondary | ICD-10-CM | POA: Diagnosis not present

## 2019-11-25 DIAGNOSIS — M62830 Muscle spasm of back: Secondary | ICD-10-CM

## 2019-11-25 DIAGNOSIS — G8929 Other chronic pain: Secondary | ICD-10-CM

## 2019-11-25 DIAGNOSIS — M5441 Lumbago with sciatica, right side: Secondary | ICD-10-CM

## 2019-11-25 NOTE — Therapy (Signed)
[facility] [address] [place], [place], [address] Phone: [phone]   Fax:  [phone]  Physical Therapy Treatment  Patient Details  Name: Brian Tran MRN: [id number] Date of Birth: Jul 15, 1976 Referring Provider (PT): Jovita Gamma, MD   Encounter Date: 11/25/2019  PT End of Session - 11/25/19 0847    Visit Number  21    Date for PT Re-Evaluation  12/29/19    Authorization Type  UHC    PT Start Time  0800    PT Stop Time  0842    PT Time Calculation (min)  42 min    Activity Tolerance  Patient tolerated treatment well    Behavior During Therapy  [facility] for tasks assessed/performed       Past Medical History:  Diagnosis Date  . Anxiety   . DDD (degenerative disc disease), cervical   . Fatty liver   . GERD (gastroesophageal reflux disease)   . Hyperlipidemia   . Neuromuscular disorder ([place])    right ulnar nerve pain  . Seasonal allergies     Past Surgical History:  Procedure Laterality Date  . ANTERIOR CERVICAL DECOMPRESSION/DISCECTOMY FUSION 4 LEVELS N/A 10/25/2016   Procedure: ANTERIOR CERVICAL DECOMPRESSION/DISCECTOMY FUSION  CERVICAL THREE-CERVICAL FOUR  CERVICAL FOUR-CERVICAL FIVE ,CERVICAL FIVE-CERVICAL SIX ,CERVICAL SIX-CERVICAL SEVEN;  Surgeon: Jovita Gamma, MD;  Location: [place];  Service: Neurosurgery;  Laterality: N/A;  . BRONCHOSCOPY    . HERNIA REPAIR Right    inguinal hernia  . WISDOM TOOTH EXTRACTION      There were no vitals filed for this visit.  Subjective Assessment - 11/25/19 0830    Subjective  I slipped on a hole in the yard and the Rt side of my back really hurts.    Currently in Pain?  Yes    Pain Score  8     Pain Location  Back    Pain Orientation  Right    Pain Descriptors / Indicators  Burning    Pain Type  Chronic pain    Pain Onset  More than a month ago    Aggravating Factors   everything    Pain Relieving Factors  stretching, Tylenol    Pain Score  6    Pain Location  Neck    Pain Orientation  Right;Left    Pain Descriptors / Indicators  Aching;Spasm    Pain Type  Chronic pain    Pain Onset  More than a month ago    Pain Frequency  Constant                       [facility] Adult PT Treatment/Exercise - 11/25/19 0001      Lumbar Exercises: Stretches   Active Hamstring Stretch  Right;Left;20 seconds;2 reps    Lower Trunk Rotation  3 reps;20 seconds      Lumbar Exercises: Aerobic   Nustep  Level 4 x 10 minutes- arms and legs   PT present to discuss progress     Manual Therapy   Manual Therapy  Soft tissue mobilization;Myofascial release;Passive ROM    Manual therapy comments  Rt lumbar and gluteals    Passive ROM  hip flexion, ER and long axis hip pull       Trigger Point Dry Needling - 11/25/19 0001    Consent Given?  Yes    Education Handout Provided  Previously provided    Muscles Treated Back/Hip  Gluteus minimus;Gluteus medius;Erector spinae  Rt only   Gluteus Minimus Response  Twitch response elicited;Palpable increased muscle length    Gluteus Medius Response  Twitch response elicited;Palpable increased muscle length    Erector spinae Response  Twitch response elicited;Palpable increased muscle length             PT Short Term Goals - 10/21/19 0806      PT SHORT TERM GOAL #2   Title  report < or = to 4/10 LBP with sitting for work and riding in the car    Baseline  up to 7/10 in the morning.  Pain reduces to 2/10 during the day.    Status  On-going        PT Long Term Goals - 11/17/19 0806      PT LONG TERM GOAL #1   Title  be independent in advanced HEP    Time  6    Period  Weeks    Status  On-going    Target Date  12/29/19      PT LONG TERM GOAL #2   Title  reduce FOTO to < or = to 41% limitation (lumbar)    Baseline  47% limitation    Time  6    Period  Weeks    Status  On-going    Target Date  12/29/19      PT LONG TERM GOAL #3   Title  report a 75% reduction in LBP to allow  for riding in the car for longer periods    Baseline  55-60%    Time  6    Period  Weeks    Status  On-going    Target Date  12/29/19      PT LONG TERM GOAL #4   Title  report waking < or = to 2x/night with LBP and cervical pain to improve quality and quantity of sleep    Baseline  waking 3x/night with 7-9/10 pain    Time  6    Period  Weeks    Status  On-going    Target Date  12/29/19      PT LONG TERM GOAL #5   Title  report a 60% reduction in neck pain with daily tasks    Time  6    Period  Weeks    Status  New    Target Date  12/29/19      Additional Long Term Goals   Additional Long Term Goals  Yes      PT LONG TERM GOAL #6   Title  demonstrate cervical rotation to 60 degrees bil to improve safety with driving    Time  6    Period  Weeks    Status  New    Target Date  12/29/19            Plan - 11/25/19 0847    Clinical Impression Statement  Pt presents today with report of less neck pain and reduction in bil UE tingling.  Pt had a slip in a hole in the ground and strained his Rt low back.  Session focused on manual to Rt lumbar spine and gluteals and P/ROM to Rt hip.  Pt reports that he will have MRI of his neck as soon as it is scheduled.  Pt with significant compensatory movement today related to Rt lumbar pain.  Pt will continue to benefit from skilled PT to address chronic lumbar and cervical pain.    PT Frequency  2x / week  PT Duration  6 weeks    PT Treatment/Interventions  ADLs/Self Care Home Management;Cryotherapy;Electrical Stimulation;Ultrasound;Moist Heat;Functional mobility training;Therapeutic activities;Therapeutic exercise;Neuromuscular re-education;Manual techniques;Patient/family education;Passive range of motion;Dry needling;Joint Manipulations;Taping    PT Next Visit Plan  core and hip strength, neutral spine.  Flexibility and manual to address tissue mobility and trigger points.  Review cervical A/ROM, work on relaxed shoulders    PT Home  Exercise Plan  Access Code: [email or web address]    Consulted and Agree with Plan of Care  Patient       Patient will benefit from skilled therapeutic intervention in order to improve the following deficits and impairments:  Decreased activity tolerance, Decreased balance, Decreased strength, Decreased mobility, Decreased endurance, Decreased range of motion, Postural dysfunction, Improper body mechanics, Impaired flexibility, Pain, Increased muscle spasms, Hypomobility  Visit Diagnosis: Muscle weakness (generalized)  Muscle spasm of back  Chronic right-sided low back pain with right-sided sciatica     Problem List Patient Active Problem List   Diagnosis Date Noted  . Dehydration 03/08/2017  . Lumbar stenosis 03/05/2017  . HNP (herniated nucleus pulposus), cervical 10/25/2016     Brian Tran, PT 11/25/19 8:52 AM  [facility] [address] [place], [place], [address] Phone: [phone]   Fax:  [phone]  Name: Brian Tran MRN: [id number] Date of Birth: [birth year]

## 2019-12-01 ENCOUNTER — Other Ambulatory Visit: Payer: Self-pay

## 2019-12-01 ENCOUNTER — Ambulatory Visit: Payer: 59

## 2019-12-01 DIAGNOSIS — M6281 Muscle weakness (generalized): Secondary | ICD-10-CM

## 2019-12-01 DIAGNOSIS — M542 Cervicalgia: Secondary | ICD-10-CM

## 2019-12-01 DIAGNOSIS — R293 Abnormal posture: Secondary | ICD-10-CM

## 2019-12-01 DIAGNOSIS — G8929 Other chronic pain: Secondary | ICD-10-CM

## 2019-12-01 DIAGNOSIS — M62830 Muscle spasm of back: Secondary | ICD-10-CM

## 2019-12-01 NOTE — Therapy (Signed)
[facility] [address] [place], [place], [address] Phone: [phone]   Fax:  [phone]  Physical Therapy Treatment  Patient Details  Name: Brian Tran MRN: [id number] Date of Birth: 21-Aug-1976 Referring Provider (PT): Jovita Gamma, MD   Encounter Date: 12/01/2019  PT End of Session - 12/01/19 0842    Visit Number  22    Date for PT Re-Evaluation  12/29/19    Authorization Type  UHC    PT Start Time  0758    PT Stop Time  0841    PT Time Calculation (min)  43 min    Activity Tolerance  Patient tolerated treatment well    Behavior During Therapy  [facility] for tasks assessed/performed       Past Medical History:  Diagnosis Date  . Anxiety   . DDD (degenerative disc disease), cervical   . Fatty liver   . GERD (gastroesophageal reflux disease)   . Hyperlipidemia   . Neuromuscular disorder ([place])    right ulnar nerve pain  . Seasonal allergies     Past Surgical History:  Procedure Laterality Date  . ANTERIOR CERVICAL DECOMPRESSION/DISCECTOMY FUSION 4 LEVELS N/A 10/25/2016   Procedure: ANTERIOR CERVICAL DECOMPRESSION/DISCECTOMY FUSION  CERVICAL THREE-CERVICAL FOUR  CERVICAL FOUR-CERVICAL FIVE ,CERVICAL FIVE-CERVICAL SIX ,CERVICAL SIX-CERVICAL SEVEN;  Surgeon: Jovita Gamma, MD;  Location: [facility];  Service: Neurosurgery;  Laterality: N/A;  . BRONCHOSCOPY    . HERNIA REPAIR Right    inguinal hernia  . WISDOM TOOTH EXTRACTION      There were no vitals filed for this visit.  Subjective Assessment - 12/01/19 0756    Subjective  I still haven't heard from the MD.  My arms are shaking now.    Currently in Pain?  Yes    Pain Score  5     Pain Location  Back    Pain Orientation  Right    Pain Descriptors / Indicators  Burning    Pain Type  Chronic pain    Pain Onset  More than a month ago    Pain Frequency  Constant    Pain Score  7    Pain Location  Neck    Pain Orientation  Right;Left    Pain  Descriptors / Indicators  Aching;Spasm    Pain Type  Chronic pain    Pain Onset  More than a month ago    Pain Frequency  Constant    Aggravating Factors   it depends    Pain Relieving Factors  relaxing         [facility] PT Assessment - 12/01/19 0001      Assessment   Medical Diagnosis  chronic bilateral LBP without sciatica, cervicalgia    Referring Provider (PT)  Jovita Gamma, MD      Observation/Other Assessments   Focus on Therapeutic Outcomes (FOTO)   55% limitation                   [facility] Adult PT Treatment/Exercise - 12/01/19 0001      Neck Exercises: Machines for Strengthening   UBE (Upper Arm Bike)  Level 1x 4 minutes (3/1)-fatigue and pain so stopped early      Neck Exercises: Theraband   Rows  20 reps;Red    Rows Limitations  max tactile cues for alignment    Other Theraband Exercises  shoulder extension red band 210      Lumbar Exercises: Aerobic   Nustep  Level 4  x 10 minutes- arms and legs   PT present to discuss progress     Lumbar Exercises: Standing   Other Standing Lumbar Exercises  walking in reverse 25# 2x10      Lumbar Exercises: Seated   Other Seated Lumbar Exercises  seated on green ball: 1# weights flexion and scaption with core activation 2x10    Other Seated Lumbar Exercises  press into foam roll x 10      Knee/Hip Exercises: Stretches   Active Hamstring Stretch  Both;3 reps;20 seconds               PT Short Term Goals - 10/21/19 0806      PT SHORT TERM GOAL #2   Title  report < or = to 4/10 LBP with sitting for work and riding in the car    Baseline  up to 7/10 in the morning.  Pain reduces to 2/10 during the day.    Status  On-going        PT Long Term Goals - 11/17/19 0806      PT LONG TERM GOAL #1   Title  be independent in advanced HEP    Time  6    Period  Weeks    Status  On-going    Target Date  12/29/19      PT LONG TERM GOAL #2   Title  reduce FOTO to < or = to 41% limitation (lumbar)     Baseline  47% limitation    Time  6    Period  Weeks    Status  On-going    Target Date  12/29/19      PT LONG TERM GOAL #3   Title  report a 75% reduction in LBP to allow for riding in the car for longer periods    Baseline  55-60%    Time  6    Period  Weeks    Status  On-going    Target Date  12/29/19      PT LONG TERM GOAL #4   Title  report waking < or = to 2x/night with LBP and cervical pain to improve quality and quantity of sleep    Baseline  waking 3x/night with 7-9/10 pain    Time  6    Period  Weeks    Status  On-going    Target Date  12/29/19      PT LONG TERM GOAL #5   Title  report a 60% reduction in neck pain with daily tasks    Time  6    Period  Weeks    Status  New    Target Date  12/29/19      Additional Long Term Goals   Additional Long Term Goals  Yes      PT LONG TERM GOAL #6   Title  demonstrate cervical rotation to 60 degrees bil to improve safety with driving    Time  6    Period  Weeks    Status  New    Target Date  12/29/19            Plan - 12/01/19 0817    Clinical Impression Statement  Pt continues to have high pain levels of pain in the neck and low back.  Pt has contacted the MD and will have MRI scheduled soon. FOTO is 55% limitation, a decline from last time assessed.   PT continues to focus on alignment and strength with emphasis on  core activation.  Pt exhibited fatigue and increased symptoms in the neck in the clinic today with exercise and required brief periods of rest.  PT provided tactile and verbal cues. Pt will continue to benefit from skilled PT to address functional weakness and prevent further strength decline during this time of flare-up.    PT Frequency  2x / week    PT Duration  6 weeks    PT Treatment/Interventions  ADLs/Self Care Home Management;Cryotherapy;Electrical Stimulation;Ultrasound;Moist Heat;Functional mobility training;Therapeutic activities;Therapeutic exercise;Neuromuscular re-education;Manual  techniques;Patient/family education;Passive range of motion;Dry needling;Joint Manipulations;Taping    PT Next Visit Plan  core and hip strength, neutral spine.  Flexibility and manual to address tissue mobility and trigger points.    PT Home Exercise Plan  Access Code: [email or web address]    Consulted and Agree with Plan of Care  Patient       Patient will benefit from skilled therapeutic intervention in order to improve the following deficits and impairments:  Decreased activity tolerance, Decreased balance, Decreased strength, Decreased mobility, Decreased endurance, Decreased range of motion, Postural dysfunction, Improper body mechanics, Impaired flexibility, Pain, Increased muscle spasms, Hypomobility  Visit Diagnosis: Muscle weakness (generalized)  Muscle spasm of back  Chronic right-sided low back pain with right-sided sciatica  Cervicalgia  Abnormal posture     Problem List Patient Active Problem List   Diagnosis Date Noted  . Dehydration 03/08/2017  . Lumbar stenosis 03/05/2017  . HNP (herniated nucleus pulposus), cervical 10/25/2016    Sigurd Sos, PT 12/01/19 8:44 AM  [facility] [address], [place], [place], [address] Phone: [phone]   Fax:  [phone]  Name: BODIE CROCCO MRN: [id number] Date of Birth: 05-31-1976

## 2019-12-03 ENCOUNTER — Other Ambulatory Visit: Payer: Self-pay

## 2019-12-03 ENCOUNTER — Ambulatory Visit: Payer: 59

## 2019-12-03 DIAGNOSIS — M5441 Lumbago with sciatica, right side: Secondary | ICD-10-CM

## 2019-12-03 DIAGNOSIS — R293 Abnormal posture: Secondary | ICD-10-CM

## 2019-12-03 DIAGNOSIS — M6281 Muscle weakness (generalized): Secondary | ICD-10-CM | POA: Diagnosis not present

## 2019-12-03 DIAGNOSIS — M62830 Muscle spasm of back: Secondary | ICD-10-CM

## 2019-12-03 DIAGNOSIS — G8929 Other chronic pain: Secondary | ICD-10-CM

## 2019-12-03 DIAGNOSIS — M542 Cervicalgia: Secondary | ICD-10-CM

## 2019-12-03 NOTE — Therapy (Signed)
[facility] [address] [place], [place], [address] Phone: [phone]   Fax:  [phone]  Physical Therapy Treatment  Patient Details  Name: Brian Tran MRN: [id number] Date of Birth: 09/24/1975 Referring Provider (Brian Tran): Jovita Gamma, MD   Encounter Date: 12/03/2019  Brian Tran End of Session - 12/03/19 0829    Visit Number  23    Date for Brian Tran Re-Evaluation  12/29/19    Authorization Type  UHC    Brian Tran Start Time  0751    Brian Tran Stop Time  0834    Brian Tran Time Calculation (min)  43 min    Activity Tolerance  Patient tolerated treatment well    Behavior During Therapy  [facility] for tasks assessed/performed       Past Medical History:  Diagnosis Date  . Anxiety   . DDD (degenerative disc disease), cervical   . Fatty liver   . GERD (gastroesophageal reflux disease)   . Hyperlipidemia   . Neuromuscular disorder ([place])    right ulnar nerve pain  . Seasonal allergies     Past Surgical History:  Procedure Laterality Date  . ANTERIOR CERVICAL DECOMPRESSION/DISCECTOMY FUSION 4 LEVELS N/A 10/25/2016   Procedure: ANTERIOR CERVICAL DECOMPRESSION/DISCECTOMY FUSION  CERVICAL THREE-CERVICAL FOUR  CERVICAL FOUR-CERVICAL FIVE ,CERVICAL FIVE-CERVICAL SIX ,CERVICAL SIX-CERVICAL SEVEN;  Surgeon: Jovita Gamma, MD;  Location: [facility];  Service: Neurosurgery;  Laterality: N/A;  . BRONCHOSCOPY    . HERNIA REPAIR Right    inguinal hernia  . WISDOM TOOTH EXTRACTION      There were no vitals filed for this visit.  Subjective Assessment - 12/03/19 0752    Subjective  I still haven't heard from the MD.  I have been stretching.  I feel a little bit better today.    Currently in Pain?  Yes    Pain Location  Back    Pain Score  6    Pain Location  Neck    Pain Orientation  Right;Left;Lower    Pain Descriptors / Indicators  Aching;Spasm                       [facility] Adult Brian Tran Treatment/Exercise - 12/03/19 0001      Neck Exercises:  Theraband   Rows  20 reps    Rows Limitations  max tactile cues for alignment.  25#     Other Theraband Exercises  shoulder extension 20# 2x10      Lumbar Exercises: Aerobic   Nustep  Level 4 x 10 minutes- arms and legs   Brian Tran present to discuss progress     Lumbar Exercises: Standing   Other Standing Lumbar Exercises  walking in reverse 25# 2x10      Lumbar Exercises: Seated   Other Seated Lumbar Exercises  seated on green ball: 1# weights flexion and scaption with core activation 2x10    Other Seated Lumbar Exercises  press into foam roll x 10      Knee/Hip Exercises: Stretches   Active Hamstring Stretch  Both;3 reps;20 seconds      Knee/Hip Exercises: Standing   Forward Step Up  2 sets;10 reps;Both;Step Height: 6";Hand Hold: 2               Brian Tran Short Term Goals - 10/21/19 0806      Brian Tran SHORT TERM GOAL #2   Title  report < or = to 4/10 LBP with sitting for work and riding in the car  Baseline  up to 7/10 in the morning.  Pain reduces to 2/10 during the day.    Status  On-going        Brian Tran Long Term Goals - 11/17/19 0806      Brian Tran LONG TERM GOAL #1   Title  be independent in advanced HEP    Time  6    Period  Weeks    Status  On-going    Target Date  12/29/19      Brian Tran LONG TERM GOAL #2   Title  reduce FOTO to < or = to 41% limitation (lumbar)    Baseline  47% limitation    Time  6    Period  Weeks    Status  On-going    Target Date  12/29/19      Brian Tran LONG TERM GOAL #3   Title  report a 75% reduction in LBP to allow for riding in the car for longer periods    Baseline  55-60%    Time  6    Period  Weeks    Status  On-going    Target Date  12/29/19      Brian Tran LONG TERM GOAL #4   Title  report waking < or = to 2x/night with LBP and cervical pain to improve quality and quantity of sleep    Baseline  waking 3x/night with 7-9/10 pain    Time  6    Period  Weeks    Status  On-going    Target Date  12/29/19      Brian Tran LONG TERM GOAL #5   Title  report a 60%  reduction in neck pain with daily tasks    Time  6    Period  Weeks    Status  New    Target Date  12/29/19      Additional Long Term Goals   Additional Long Term Goals  Yes      Brian Tran LONG TERM GOAL #6   Title  demonstrate cervical rotation to 60 degrees bil to improve safety with driving    Time  6    Period  Weeks    Status  New    Target Date  12/29/19            Plan - 12/03/19 0813    Clinical Impression Statement  Brian Tran with continued UE and LE symptoms although these are reduced overall today.  Brian Tran is able to participate in strength exercises today and demonstrates fatigue with UE endurance tasks requiring short rest breaks.  Brian Tran with some increased Lt LE radiculopathy during treatment with prolonged sitting or standing.  Brian Tran requires tactile and verbal cues for technique with exercise.  Brian Tran will continue to benefit from skilled Brian Tran to address chronic pain, functional weakness and flexibility.    Brian Tran Frequency  2x / week    Brian Tran Duration  6 weeks    Brian Tran Treatment/Interventions  ADLs/Self Care Home Management;Cryotherapy;Electrical Stimulation;Ultrasound;Moist Heat;Functional mobility training;Therapeutic activities;Therapeutic exercise;Neuromuscular re-education;Manual techniques;Patient/family education;Passive range of motion;Dry needling;Joint Manipulations;Taping    Brian Tran Next Visit Plan  core and hip strength, neutral spine.  Flexibility and manual to address tissue mobility and trigger points.    Brian Tran Home Exercise Plan  Access Code: [email or web address]    Consulted and Agree with Plan of Care  Patient       Patient will benefit from skilled therapeutic intervention in order to improve the following deficits and impairments:  Decreased activity tolerance, Decreased balance, Decreased  strength, Decreased mobility, Decreased endurance, Decreased range of motion, Postural dysfunction, Improper body mechanics, Impaired flexibility, Pain, Increased muscle spasms, Hypomobility  Visit Diagnosis: Muscle  spasm of back  Muscle weakness (generalized)  Chronic right-sided low back pain with right-sided sciatica  Cervicalgia  Abnormal posture     Problem List Patient Active Problem List   Diagnosis Date Noted  . Dehydration 03/08/2017  . Lumbar stenosis 03/05/2017  . HNP (herniated nucleus pulposus), cervical 10/25/2016    Brian Tran, Brian Tran 12/03/19 8:30 AM  [facility] [address], [place], [place], [address] Phone: [phone]   Fax:  [phone]  Name: Brian Tran MRN: [id number] Date of Birth: [birth year]

## 2019-12-08 ENCOUNTER — Ambulatory Visit: Payer: 59

## 2019-12-08 ENCOUNTER — Other Ambulatory Visit: Payer: Self-pay

## 2019-12-08 DIAGNOSIS — M62830 Muscle spasm of back: Secondary | ICD-10-CM

## 2019-12-08 DIAGNOSIS — M542 Cervicalgia: Secondary | ICD-10-CM

## 2019-12-08 DIAGNOSIS — M5441 Lumbago with sciatica, right side: Secondary | ICD-10-CM

## 2019-12-08 DIAGNOSIS — M6281 Muscle weakness (generalized): Secondary | ICD-10-CM

## 2019-12-08 DIAGNOSIS — G8929 Other chronic pain: Secondary | ICD-10-CM

## 2019-12-08 DIAGNOSIS — R293 Abnormal posture: Secondary | ICD-10-CM

## 2019-12-08 NOTE — Therapy (Signed)
[facility] [address], [place], [place], [address] Phone: [phone]   Fax:  [phone]  Physical Therapy Treatment  Patient Details  Name: Brian Tran MRN: [id number] Date of Birth: February 16, 1976 Referring Provider (PT): Jovita Gamma, MD   Encounter Date: 12/08/2019  PT End of Session - 12/08/19 0821    Visit Number  24    Date for PT Re-Evaluation  12/29/19    Authorization Type  UHC    PT Start Time  0753    PT Stop Time  0815    PT Time Calculation (min)  22 min    Activity Tolerance  Other (comment)   significant change in Rt UE/LE strength so session ended   Behavior During Therapy  [facility] for tasks assessed/performed       Past Medical History:  Diagnosis Date  . Anxiety   . DDD (degenerative disc disease), cervical   . Fatty liver   . GERD (gastroesophageal reflux disease)   . Hyperlipidemia   . Neuromuscular disorder ([place])    right ulnar nerve pain  . Seasonal allergies     Past Surgical History:  Procedure Laterality Date  . ANTERIOR CERVICAL DECOMPRESSION/DISCECTOMY FUSION 4 LEVELS N/A 10/25/2016   Procedure: ANTERIOR CERVICAL DECOMPRESSION/DISCECTOMY FUSION  CERVICAL THREE-CERVICAL FOUR  CERVICAL FOUR-CERVICAL FIVE ,CERVICAL FIVE-CERVICAL SIX ,CERVICAL SIX-CERVICAL SEVEN;  Surgeon: Jovita Gamma, MD;  Location: [facility];  Service: Neurosurgery;  Laterality: N/A;  . BRONCHOSCOPY    . HERNIA REPAIR Right    inguinal hernia  . WISDOM TOOTH EXTRACTION      There were no vitals filed for this visit.  Subjective Assessment - 12/08/19 0752    Subjective  I am going to see the MD today.  My legs don't seem stable.  Pt denies bowel or bladder changes.    Pertinent History  cervical fusion 2018 4-5 levels, L3-5 fusion 02/2017    Patient Stated Goals  reduce LBP, sit/stand/walk longer without limitation.    Currently in Pain?  Yes    Pain Location  Back    Pain Orientation  Left;Right    Pain  Descriptors / Indicators  Burning;Pins and needles    Pain Type  Chronic pain    Pain Radiating Towards  legs- instability    Pain Onset  More than a month ago    Pain Frequency  Constant    Aggravating Factors   no pattern    Pain Relieving Factors  stretching, Tylenol    Pain Score  5    Pain Location  Neck    Pain Orientation  Right;Left    Pain Descriptors / Indicators  Pins and needles;Burning    Pain Type  Chronic pain    Pain Onset  More than a month ago    Pain Frequency  Constant    Aggravating Factors   no pattern         [facility] PT Assessment - 12/08/19 0001      Assessment   Medical Diagnosis  chronic bilateral LBP without sciatica, cervicalgia    Referring Provider (PT)  Jovita Gamma, MD      Strength   Strength Assessment Site  Wrist;Knee    Right/Left Wrist  Right;Left    Right Wrist Flexion  3+/5    Right Wrist Extension  3+/5    Right Wrist Radial Deviation  3+/5    Left Wrist Flexion  5/5    Left Wrist Extension  5/5  Left Wrist Radial Deviation  5/5    Right Hand Grip (lbs)  15    Left Hand Grip (lbs)  58    Right/Left Knee  Right;Left    Right Knee Flexion  3+/5    Right Knee Extension  4-/5    Left Knee Flexion  4/5    Left Knee Extension  4/5                   [facility] Adult PT Treatment/Exercise - 12/08/19 0001      Lumbar Exercises: Aerobic   Nustep  Level 4 x 10 minutes- arms and legs   PT present to discuss progress              PT Short Term Goals - 10/21/19 0806      PT SHORT TERM GOAL #2   Title  report < or = to 4/10 LBP with sitting for work and riding in the car    Baseline  up to 7/10 in the morning.  Pain reduces to 2/10 during the day.    Status  On-going        PT Long Term Goals - 11/17/19 0806      PT LONG TERM GOAL #1   Title  be independent in advanced HEP    Time  6    Period  Weeks    Status  On-going    Target Date  12/29/19      PT LONG TERM GOAL #2   Title  reduce FOTO to < or = to  41% limitation (lumbar)    Baseline  47% limitation    Time  6    Period  Weeks    Status  On-going    Target Date  12/29/19      PT LONG TERM GOAL #3   Title  report a 75% reduction in LBP to allow for riding in the car for longer periods    Baseline  55-60%    Time  6    Period  Weeks    Status  On-going    Target Date  12/29/19      PT LONG TERM GOAL #4   Title  report waking < or = to 2x/night with LBP and cervical pain to improve quality and quantity of sleep    Baseline  waking 3x/night with 7-9/10 pain    Time  6    Period  Weeks    Status  On-going    Target Date  12/29/19      PT LONG TERM GOAL #5   Title  report a 60% reduction in neck pain with daily tasks    Time  6    Period  Weeks    Status  New    Target Date  12/29/19      Additional Long Term Goals   Additional Long Term Goals  Yes      PT LONG TERM GOAL #6   Title  demonstrate cervical rotation to 60 degrees bil to improve safety with driving    Time  6    Period  Weeks    Status  New    Target Date  12/29/19            Plan - 12/08/19 0820    Clinical Impression Statement  Pt arrived with report of Rt UE and LE weakness and numbness/tingling that has began and worsened over the past couple of days.  Pt has been  in contact with the MD and will have appointment this afternoon for assessment.  PT assessed strength of UEs and LE.  Pt demonstrates significant loss of Lt grip strength since last tested.  Rt grip today was 15# (11/17/19 was 58#) and Rt wrist strength is 3+/5.  Rt LE strength is diminished as well.  Session stopped early and pt advised to use a cane in Lt UE to address Rt LE strength deficits and see MD today to discuss recent progression of symptoms.    PT Frequency  2x / week    PT Duration  6 weeks    PT Treatment/Interventions  ADLs/Self Care Home Management;Cryotherapy;Electrical Stimulation;Ultrasound;Moist Heat;Functional mobility training;Therapeutic activities;Therapeutic  exercise;Neuromuscular re-education;Manual techniques;Patient/family education;Passive range of motion;Dry needling;Joint Manipulations;Taping    PT Next Visit Plan  see what MD says    PT Home Exercise Plan  Access Code: [email or web address]    Consulted and Agree with Plan of Care  Patient       Patient will benefit from skilled therapeutic intervention in order to improve the following deficits and impairments:  Decreased activity tolerance, Decreased balance, Decreased strength, Decreased mobility, Decreased endurance, Decreased range of motion, Postural dysfunction, Improper body mechanics, Impaired flexibility, Pain, Increased muscle spasms, Hypomobility  Visit Diagnosis: Muscle spasm of back  Muscle weakness (generalized)  Chronic right-sided low back pain with right-sided sciatica  Cervicalgia  Abnormal posture     Problem List Patient Active Problem List   Diagnosis Date Noted  . Dehydration 03/08/2017  . Lumbar stenosis 03/05/2017  . HNP (herniated nucleus pulposus), cervical 10/25/2016     Sigurd Sos, PT 12/08/19 8:23 AM  [facility] [address], [place], [place], [address] Phone: [phone]   Fax:  [phone]  Name: Brian Tran MRN: [id number] Date of Birth: 1975-11-06

## 2019-12-10 ENCOUNTER — Other Ambulatory Visit: Payer: Self-pay

## 2019-12-10 ENCOUNTER — Ambulatory Visit: Payer: 59

## 2019-12-10 DIAGNOSIS — R293 Abnormal posture: Secondary | ICD-10-CM

## 2019-12-10 DIAGNOSIS — M542 Cervicalgia: Secondary | ICD-10-CM

## 2019-12-10 DIAGNOSIS — M6281 Muscle weakness (generalized): Secondary | ICD-10-CM | POA: Diagnosis not present

## 2019-12-10 DIAGNOSIS — G8929 Other chronic pain: Secondary | ICD-10-CM

## 2019-12-10 DIAGNOSIS — M5441 Lumbago with sciatica, right side: Secondary | ICD-10-CM

## 2019-12-10 DIAGNOSIS — M62830 Muscle spasm of back: Secondary | ICD-10-CM

## 2019-12-10 NOTE — Therapy (Signed)
[facility] [address] [place], [place], [address] Phone: [phone]   Fax:  [phone]  Physical Therapy Treatment  Patient Details  Name: Brian Tran MRN: [id number] Date of Birth: October 18, 1975 Referring Provider (Brian Tran): Jovita Gamma, MD   Encounter Date: 12/10/2019  Brian Tran End of Session - 12/10/19 0827    Visit Number  25    Brian Tran Start Time  5625    Brian Tran Stop Time  0832    Brian Tran Time Calculation (min)  38 min    Activity Tolerance  Patient tolerated treatment well    Behavior During Therapy  [facility] for tasks assessed/performed       Past Medical History:  Diagnosis Date  . Anxiety   . DDD (degenerative disc disease), cervical   . Fatty liver   . GERD (gastroesophageal reflux disease)   . Hyperlipidemia   . Neuromuscular disorder ([place])    right ulnar nerve pain  . Seasonal allergies     Past Surgical History:  Procedure Laterality Date  . ANTERIOR CERVICAL DECOMPRESSION/DISCECTOMY FUSION 4 LEVELS N/A 10/25/2016   Procedure: ANTERIOR CERVICAL DECOMPRESSION/DISCECTOMY FUSION  CERVICAL THREE-CERVICAL FOUR  CERVICAL FOUR-CERVICAL FIVE ,CERVICAL FIVE-CERVICAL SIX ,CERVICAL SIX-CERVICAL SEVEN;  Surgeon: Jovita Gamma, MD;  Location: [facility];  Service: Neurosurgery;  Laterality: N/A;  . BRONCHOSCOPY    . HERNIA REPAIR Right    inguinal hernia  . WISDOM TOOTH EXTRACTION      There were no vitals filed for this visit.  Subjective Assessment - 12/10/19 0756    Subjective  I saw the MD- ordered a CT and MRI for this week. I follow-up on 12/24/19.  He did x-ray and C6-7 is not healing from my fusion.    Pertinent History  cervical fusion 2018 4-5 levels, L3-5 fusion 02/2017         [facility] Brian Tran Assessment - 12/10/19 0001      Assessment   Medical Diagnosis  chronic bilateral LBP without sciatica, cervicalgia    Referring Provider (Brian Tran)  Jovita Gamma, MD      Observation/Other Assessments   Focus on Therapeutic  Outcomes (FOTO)   55% limitation      AROM   Cervical - Right Rotation  45    Cervical - Left Rotation  40      Strength   Strength Assessment Site  Wrist;Knee    Right Wrist Flexion  3+/5    Right Wrist Extension  3+/5    Right Wrist Radial Deviation  3+/5    Left Wrist Flexion  5/5    Left Wrist Extension  5/5    Left Wrist Radial Deviation  5/5    Right Hand Grip (lbs)  20    Left Hand Grip (lbs)  58    Right/Left Knee  Left;Right    Right Knee Flexion  3+/5    Right Knee Extension  4-/5    Left Knee Flexion  4/5    Left Knee Extension  4/5                   [facility] Adult Brian Tran Treatment/Exercise - 12/10/19 0001      Neck Exercises: Machines for Strengthening   UBE (Upper Arm Bike)  Level 1x 4 minutes (2/2)-fatigue and pain so stopped early      Neck Exercises: Seated   Other Seated Exercise  mass grip red x 3 minutes       Neck Exercises: Supine   Other Supine  Exercise  attempted cervcial rotation in supine: not able to complete due to pain      Lumbar Exercises: Aerobic   Nustep  Level 4 x 10 minutes- legs only   Brian Tran present to discuss progress     Knee/Hip Exercises: Sidelying   Clams  2x10 with yellow band                Brian Tran Short Term Goals - 10/21/19 0806      Brian Tran SHORT TERM GOAL #2   Title  report < or = to 4/10 LBP with sitting for work and riding in the car    Baseline  up to 7/10 in the morning.  Pain reduces to 2/10 during the day.    Status  On-going        Brian Tran Long Term Goals - 12/10/19 0758      Brian Tran LONG TERM GOAL #1   Title  be independent in advanced HEP    Time  6    Period  Weeks    Status  Achieved      Brian Tran LONG TERM GOAL #2   Title  reduce FOTO to < or = to 41% limitation (lumbar)    Baseline  47% limitation    Status  Partially Met      Brian Tran LONG TERM GOAL #3   Title  report a 75% reduction in LBP to allow for riding in the car for longer periods    Baseline  55-60%    Status  Partially Met      Brian Tran LONG TERM GOAL #4    Title  report waking < or = to 2x/night with LBP and cervical pain to improve quality and quantity of sleep    Baseline  waking 3x/night with 7-9/10 pain    Status  Not Met      Brian Tran LONG TERM GOAL #5   Title  report a 60% reduction in neck pain with daily tasks    Baseline  will need surgery    Status  Not Met      Brian Tran LONG TERM GOAL #6   Title  demonstrate cervical rotation to 60 degrees bil to improve safety with driving    Status  Not Met            Plan - 12/10/19 1610    Clinical Impression Statement  Brian Tran will be discharged today as he will return to the MD for further diagnostics.  Brian Tran with progression of Rt UE and LE weakness and numbness/tingling that has began and worsened over the past 4-5 days.  Brian Tran had x-ray that showed non-healing at C6-7 from previous fusion.  Brian Tran assessed strength of UEs and LE this week.  Brian Tran demonstrated significant loss of Lt grip strength since last tested.  Rt grip today was 20# (11/17/19 was 58#) and Rt wrist strength is 3+/5.  Rt LE strength is diminished as well.  Brian Tran will D/C today and return to MD for further assessment.    Brian Tran Next Visit Plan  D/C Brian Tran to HEP and return to MD    Brian Tran Home Exercise Plan  Access Code: [email or web address]    Consulted and Agree with Plan of Care  Patient       Patient will benefit from skilled therapeutic intervention in order to improve the following deficits and impairments:     Visit Diagnosis: Muscle spasm of back  Muscle weakness (generalized)  Chronic right-sided low back pain with right-sided sciatica  Cervicalgia  Abnormal posture     Problem List Patient Active Problem List   Diagnosis Date Noted  . Dehydration 03/08/2017  . Lumbar stenosis 03/05/2017  . HNP (herniated nucleus pulposus), cervical 10/25/2016   PHYSICAL THERAPY DISCHARGE SUMMARY  Visits from Start of Care: 25  Current functional level related to goals / functional outcomes: Brian Tran will return to MD for further diagnostics.  Brian Tran with onset of  worsening Rt UE and LE symptoms over the past week.     Remaining deficits: UE/LE strength deficits and lumbar/cervical pain.     Education / Equipment: HEP Plan: Patient agrees to discharge.  Patient goals were not met. Patient is being discharged due to a change in medical status.  ?????         Brian Tran, Brian Tran 12/10/19 8:28 AM  [facility] [address] [place], [place], [address] Phone: [phone]   Fax:  [phone]  Name: Brian Tran MRN: [id number] Date of Birth: May 16, 1976

## 2019-12-17 ENCOUNTER — Other Ambulatory Visit (HOSPITAL_COMMUNITY): Payer: Self-pay | Admitting: Neurosurgery

## 2019-12-17 ENCOUNTER — Other Ambulatory Visit: Payer: Self-pay | Admitting: Neurosurgery

## 2019-12-17 DIAGNOSIS — S129XXA Fracture of neck, unspecified, initial encounter: Secondary | ICD-10-CM

## 2019-12-18 ENCOUNTER — Telehealth: Payer: Self-pay

## 2019-12-18 NOTE — Telephone Encounter (Signed)
Spoke to patient to inquire if he was ready to schedule his EGD/[facility] at [facility]. Patient states that he is putting this procedure off until further notice as he does not have insurance and not sure when he will. Patient said he will contact the office when he is ready.

## 2019-12-22 ENCOUNTER — Other Ambulatory Visit: Payer: Self-pay

## 2019-12-22 ENCOUNTER — Ambulatory Visit (HOSPITAL_COMMUNITY)
Admission: RE | Admit: 2019-12-22 | Discharge: 2019-12-22 | Disposition: A | Discharge status: Home / Self Care | Payer: 59 | Source: Ambulatory Visit | Attending: Neurosurgery | Admitting: Neurosurgery

## 2019-12-22 DIAGNOSIS — S129XXA Fracture of neck, unspecified, initial encounter: Secondary | ICD-10-CM | POA: Insufficient documentation

## 2020-01-01 ENCOUNTER — Other Ambulatory Visit: Payer: 59

## 2020-04-28 ENCOUNTER — Other Ambulatory Visit: Payer: Self-pay | Admitting: Neurological Surgery

## 2020-04-28 DIAGNOSIS — G8929 Other chronic pain: Secondary | ICD-10-CM

## 2020-04-28 DIAGNOSIS — M5412 Radiculopathy, cervical region: Secondary | ICD-10-CM

## 2020-04-29 ENCOUNTER — Telehealth: Payer: Self-pay | Admitting: Nurse Practitioner

## 2020-04-29 NOTE — Telephone Encounter (Signed)
Phone call to patient to verify medication list and allergies for myelogram procedure. Pt aware he will not need to hold any medications for this procedure. Pre and post procedure instructions reviewed with pt. Pt verbalized understanding.  

## 2020-05-14 ENCOUNTER — Other Ambulatory Visit: Payer: Self-pay

## 2020-05-14 ENCOUNTER — Ambulatory Visit
Admission: RE | Admit: 2020-05-14 | Discharge: 2020-05-14 | Disposition: A | Discharge status: Home / Self Care | Payer: 59 | Source: Ambulatory Visit | Attending: Neurological Surgery | Admitting: Neurological Surgery

## 2020-05-14 DIAGNOSIS — M545 Low back pain, unspecified: Secondary | ICD-10-CM

## 2020-05-14 DIAGNOSIS — G8929 Other chronic pain: Secondary | ICD-10-CM

## 2020-05-14 DIAGNOSIS — M5412 Radiculopathy, cervical region: Secondary | ICD-10-CM

## 2020-05-14 MED ORDER — DIAZEPAM 5 MG PO TABS
10.0000 mg | ORAL_TABLET | Freq: Once | ORAL | Status: AC
  Administered 2020-05-14: 10 mg via ORAL

## 2020-05-14 MED ORDER — IOPAMIDOL (ISOVUE-M 300) INJECTION 61%: 10.0000 mL | Freq: Once | INTRAMUSCULAR | Status: DC | PRN

## 2020-05-14 MED ORDER — MEPERIDINE HCL 50 MG/ML IJ SOLN
50.0000 mg | Freq: Once | INTRAMUSCULAR | Status: AC
  Administered 2020-05-14: 50 mg via INTRAMUSCULAR

## 2020-05-14 MED ORDER — ONDANSETRON HCL 4 MG/2ML IJ SOLN
4.0000 mg | Freq: Once | INTRAMUSCULAR | Status: AC
  Administered 2020-05-14: 4 mg via INTRAMUSCULAR

## 2020-05-14 NOTE — Discharge Instructions (Signed)

## 2021-06-27 ENCOUNTER — Other Ambulatory Visit: Payer: Self-pay | Admitting: Orthopaedic Surgery

## 2021-06-27 DIAGNOSIS — M4326 Fusion of spine, lumbar region: Secondary | ICD-10-CM

## 2021-06-30 ENCOUNTER — Ambulatory Visit
Admission: RE | Admit: 2021-06-30 | Discharge: 2021-06-30 | Disposition: A | Discharge status: Home / Self Care | Payer: 59 | Source: Ambulatory Visit | Attending: Orthopaedic Surgery | Admitting: Orthopaedic Surgery

## 2021-06-30 ENCOUNTER — Other Ambulatory Visit: Payer: Self-pay

## 2021-06-30 DIAGNOSIS — M4326 Fusion of spine, lumbar region: Secondary | ICD-10-CM

## 2021-07-06 ENCOUNTER — Other Ambulatory Visit: Payer: Self-pay | Admitting: Neurological Surgery

## 2021-07-06 DIAGNOSIS — S129XXS Fracture of neck, unspecified, sequela: Secondary | ICD-10-CM

## 2021-07-13 ENCOUNTER — Ambulatory Visit
Admission: RE | Admit: 2021-07-13 | Discharge: 2021-07-13 | Disposition: A | Discharge status: Home / Self Care | Payer: 59 | Source: Ambulatory Visit | Attending: Neurological Surgery | Admitting: Neurological Surgery

## 2021-07-13 ENCOUNTER — Other Ambulatory Visit: Payer: Self-pay

## 2021-07-13 DIAGNOSIS — S129XXS Fracture of neck, unspecified, sequela: Secondary | ICD-10-CM

## 2021-07-13 MED ORDER — ONDANSETRON HCL 4 MG/2ML IJ SOLN: 4.0000 mg | Freq: Once | INTRAMUSCULAR | Status: DC | PRN

## 2021-07-13 MED ORDER — IOPAMIDOL (ISOVUE-M 300) INJECTION 61%
10.0000 mL | Freq: Once | INTRAMUSCULAR | Status: AC
  Administered 2021-07-13: 10 mL via INTRATHECAL

## 2021-07-13 MED ORDER — DIAZEPAM 5 MG PO TABS
10.0000 mg | ORAL_TABLET | Freq: Once | ORAL | Status: AC
  Administered 2021-07-13: 10 mg via ORAL

## 2021-07-13 MED ORDER — MEPERIDINE HCL 50 MG/ML IJ SOLN: 50.0000 mg | Freq: Once | INTRAMUSCULAR | Status: DC | PRN

## 2021-07-13 NOTE — Discharge Instructions (Signed)
Myelogram Discharge Instructions  Go home and rest quietly as needed. You may resume normal activities; however, do not exert yourself strongly or do any heavy lifting today and tomorrow.   DO NOT drive today.    You may resume your normal diet and medications unless otherwise indicated. Drink a lot of extra fluids today and tomorrow.   The incidence of a spinal headache is about 5% (one in 20 patients).  If you develop a headache when you are sitting up or standing that gets better when you lie down, please lie flat for 24 hours and drink plenty of fluids until the headache goes away.  Caffeinated beverages may be helpful.   If you develop severe nausea and vomiting or a headache that does not go away with the flat bedrest after 48 hours, please call [phone].   Call your physician for a follow-up appointment.  The results of your myelogram will be sent directly to your physician by the following day.  Please call us at [phone] if you have any questions or if complications develop after you arrive home.   Discharge instructions have been explained to the patient.  The patient, or the person responsible for the patient, state they fully understands these instructions.   Thank you for visiting our office today.

## 2022-07-13 ENCOUNTER — Encounter: Payer: Self-pay | Admitting: Gastroenterology

## 2022-07-13 ENCOUNTER — Ambulatory Visit: Payer: 59 | Admitting: Gastroenterology

## 2022-07-13 VITALS — BP 138/64 | HR 98 | Ht 75.0 in | Wt 229.1 lb

## 2022-07-13 DIAGNOSIS — Z1211 Encounter for screening for malignant neoplasm of colon: Secondary | ICD-10-CM

## 2022-07-13 MED ORDER — NA SULFATE-K SULFATE-MG SULF 17.5-3.13-1.6 GM/177ML PO SOLN: 1.0000 | Freq: Once | ORAL | 0 refills | Status: AC

## 2022-07-13 NOTE — Progress Notes (Signed)
07/13/2022 DESHANE COTRONEO [id number] 1976/07/19   HISTORY OF PRESENT ILLNESS: This is a 46 year old male who is a patient of Dr. Doyne Keel.  He follows here typically for his issues with GERD, which are well controlled on pantoprazole 40 mg daily.  He is here today to discuss screening colonoscopy.  Has never had a colonoscopy in the past.  He moves his bowels regularly.  He has had issues with hemorrhoids in the past when he has been on pain medication and gotten constipated from them, but otherwise he denies any other gastrointestinal issues.   Past Medical History:  Diagnosis Date   Anxiety    DDD (degenerative disc disease), cervical    Fatty liver    GERD (gastroesophageal reflux disease)    Hyperlipidemia    Hypertension    Neuromuscular disorder (HCC)    right ulnar nerve pain   Seasonal allergies    Past Surgical History:  Procedure Laterality Date   ANTERIOR CERVICAL DECOMPRESSION/DISCECTOMY FUSION 4 LEVELS N/A 10/25/2016   Procedure: ANTERIOR CERVICAL DECOMPRESSION/DISCECTOMY FUSION  CERVICAL THREE-CERVICAL FOUR  CERVICAL FOUR-CERVICAL FIVE ,CERVICAL FIVE-CERVICAL SIX ,CERVICAL SIX-CERVICAL SEVEN;  Surgeon: Jovita Gamma, MD;  Location: [facility];  Service: Neurosurgery;  Laterality: N/A;   BRONCHOSCOPY     HERNIA REPAIR Right    inguinal hernia   WISDOM TOOTH EXTRACTION      reports that he has quit smoking. His smokeless tobacco use includes snuff. He reports current alcohol use. He reports that he does not use drugs. family history includes COPD in his father; Colon polyps in his father; Diabetes in his mother; Heart Problems in his father; Hypertension in his father; Kidney cancer in his father. Allergies  Allergen Reactions   Toradol [Ketorolac Tromethamine] Nausea Only      Outpatient Encounter Medications as of 07/13/2022  Medication Sig   acetaminophen (TYLENOL) 500 MG tablet Take 1,000 mg by mouth every 8 (eight) hours as needed for mild pain or  headache.    atorvastatin (LIPITOR) 20 MG tablet Take 1 pill three nights/week (Mon-Wed-Fri)   ibuprofen (ADVIL) 800 MG tablet Take 800 mg by mouth as needed.   montelukast (SINGULAIR) 10 MG tablet Take 10 mg by mouth daily.   Na Sulfate-K Sulfate-Mg Sulf 17.5-3.13-1.6 GM/177ML SOLN Take 1 kit by mouth once for 1 dose.   pantoprazole (PROTONIX) 40 MG tablet Take 1 tablet (40 mg total) by mouth 2 (two) times daily.   valsartan (DIOVAN) 80 MG tablet Take 80 mg by mouth daily.   gabapentin (NEURONTIN) 300 MG capsule Take 1 capsule (300 mg total) by mouth at bedtime. Start with 1 capsule at bedtime. If after 2 weeks it is well tolerated, you may increase to twice a day (Patient not taking: Reported on 04/29/2020)   [DISCONTINUED] omeprazole (PRILOSEC) 20 MG capsule Take 20 mg by mouth daily. (Patient not taking: Reported on 07/13/2022)   No facility-administered encounter medications on file as of 07/13/2022.     REVIEW OF SYSTEMS  : All other systems reviewed and negative except where noted in the History of Present Illness.   PHYSICAL EXAM: BP 138/64   Pulse 98   Ht _0  (1.905 m)   Wt 229 lb 2 oz (103.9 kg)   BMI 28.64 kg/m  General: Well developed white male in no acute distress Head: Normocephalic and atraumatic Eyes:  Sclerae anicteric, conjunctiva pink. Ears: Normal auditory acuity Lungs: Clear throughout to auscultation; no W/R/R. Heart: Regular rate and rhythm; no M/R/G.  Abdomen: Soft, non-distended.  BS present.  Non-tender. Rectal:  Will be done at the time of colonoscopy. Musculoskeletal: Symmetrical with no gross deformities  Skin: No lesions on visible extremities Extremities: No edema  Neurological: Alert oriented x 4, grossly non-focal Psychological:  Alert and cooperative. Normal mood and affect  ASSESSMENT AND PLAN: *CRC screening:  Will plan for colonoscopy with Dr. Havery Moros.  The risks, benefits, and alternatives to colonoscopy were discussed with the patient  and he consents to proceed.   CC:  Vicenta Aly, [place]

## 2022-07-13 NOTE — Patient Instructions (Addendum)
You have been scheduled for a colonoscopy. Please follow written instructions given to you at your visit today.  Please pick up your prep supplies at the pharmacy within the next 1-3 days. If you use inhalers (even only as needed), please bring them with you on the day of your procedure.  The [facility] providers would like to encourage you to use MYCHART to communicate with providers for non-urgent requests or questions.  Due to long hold times on the telephone, sending your provider a message by MYCHART may be a faster and more efficient way to get a response.  Please allow 48 business hours for a response.  Please remember that this is for non-urgent requests.   Due to recent changes in healthcare laws, you may see the results of your imaging and laboratory studies on MyChart before your provider has had a chance to review them.  We understand that in some cases there may be results that are confusing or concerning to you. Not all laboratory results come back in the same time frame and the provider may be waiting for multiple results in order to interpret others.  Please give us 48 hours in order for your provider to thoroughly review all the results before contacting the office for clarification of your results.    

## 2022-07-13 NOTE — Progress Notes (Signed)
Agree with assessment and plan as outlined.  

## 2022-07-28 ENCOUNTER — Encounter: Payer: Self-pay | Admitting: Gastroenterology

## 2022-07-28 NOTE — Telephone Encounter (Signed)
Janett Billow see the message regarding how the colon was coded.       Dear Mr. Kroft,  You have been scheduled for a colonoscopy with [facility].  We wanted you to know how your procedure will be presented to your insurance company for payment. Your procedure is being performed in the [facility]; we are considered an [place].  The average, single procedure ranges from $1400 to $4000.  We cannot bill your procedure as an " In office charge". You will receive separate bills for Professional, Facility, Pathology and Anesthesia Charges.     Your colonoscopy will be classified as a:      Diagnostic or therapeutic Colonoscopy- presented to the office with past or present gastrointestinal symptoms, polyps, GI diseases, or anemias.     Surveillance or High-Risk Screening Colonoscopy- asymptomatic (no gastrointestinal symptoms either past or present), has a personal history of GI disease, personal and or family history of colon polyps and or cancer. This covers patients that have surveillance procedures every 1-7 years. Not all insurance companies will cover a surveillance or High-Risk screening at 100%.  Many insurance companies will cover this at 80% with the remainder the patient responsibility and not consider this a preventative screening.    X  Preventative Colonoscopy Screening- asymptomatic (no gastrointestinal symptoms either past or present). Patient is over the age of 71, has no personal or family history of GI disease, colon polyps, and or colon cancer. The patient has not undergone a colonoscopy within the last 10 years. Usually covered by most insurances at 100% every 10 years.    Your insurance may require you to meet a deductible before they will pay for your procedures. A deductible is a specified amount of money that the insured must pay before an insurance company will pay a claim. Co-insurance is the percentage of costs that you pay after you  meet your deductible.    Although this is not a guarantee of benefits, as a courtesy and for your information, we can share what was quoted from your insurance carrier for your ambulatory out-patient procedure with Remo Lipps P. Armbruster, MD.  Potentially, after your $500 deductible, your procedure may be covered at 80 %.

## 2022-07-28 NOTE — Telephone Encounter (Signed)
This is a duplicate, see alternate message

## 2022-08-02 ENCOUNTER — Encounter: Payer: Self-pay | Admitting: Gastroenterology

## 2022-08-02 ENCOUNTER — Ambulatory Visit (AMBULATORY_SURGERY_CENTER): Payer: 59 | Admitting: Gastroenterology

## 2022-08-02 VITALS — BP 123/67 | HR 67 | Temp 98.9°F | Resp 14 | Ht 75.0 in | Wt 229.0 lb

## 2022-08-02 DIAGNOSIS — D120 Benign neoplasm of cecum: Secondary | ICD-10-CM

## 2022-08-02 DIAGNOSIS — D122 Benign neoplasm of ascending colon: Secondary | ICD-10-CM | POA: Diagnosis not present

## 2022-08-02 DIAGNOSIS — D125 Benign neoplasm of sigmoid colon: Secondary | ICD-10-CM | POA: Diagnosis not present

## 2022-08-02 DIAGNOSIS — Z1211 Encounter for screening for malignant neoplasm of colon: Secondary | ICD-10-CM | POA: Diagnosis present

## 2022-08-02 DIAGNOSIS — D127 Benign neoplasm of rectosigmoid junction: Secondary | ICD-10-CM

## 2022-08-02 MED ORDER — SODIUM CHLORIDE 0.9 % IV SOLN: 500.0000 mL | Freq: Once | INTRAVENOUS | Status: DC

## 2022-08-02 NOTE — Progress Notes (Signed)
[facility] History and Physical   Primary Care Physician:  Vicenta Aly, [place]   Reason for Procedure:   Colon cancer cancer screening  Plan:    colonoscopy     HPI: Brian Tran is a 46 y.o. male  here for colonoscopy screening - first time exam  . Patient denies any bowel symptoms at this time. No family history of colon cancer known. Otherwise feels well without any cardiopulmonary symptoms.   I have discussed risks / benefits of anesthesia and endoscopic procedure with Hermen A Suliman and they wish to proceed with the exams as outlined today.    Past Medical History:  Diagnosis Date   Anxiety    DDD (degenerative disc disease), cervical    Fatty liver    GERD (gastroesophageal reflux disease)    Hyperlipidemia    Hypertension    Neuromuscular disorder (HCC)    right ulnar nerve pain   Seasonal allergies     Past Surgical History:  Procedure Laterality Date   ANTERIOR CERVICAL DECOMPRESSION/DISCECTOMY FUSION 4 LEVELS N/A 10/25/2016   Procedure: ANTERIOR CERVICAL DECOMPRESSION/DISCECTOMY FUSION  CERVICAL THREE-CERVICAL FOUR  CERVICAL FOUR-CERVICAL FIVE ,CERVICAL FIVE-CERVICAL SIX ,CERVICAL SIX-CERVICAL SEVEN;  Surgeon: Jovita Gamma, MD;  Location: [facility];  Service: Neurosurgery;  Laterality: N/A;   BRONCHOSCOPY     HERNIA REPAIR Right    inguinal hernia   WISDOM TOOTH EXTRACTION      Prior to Admission medications   Medication Sig Start Date End Date Taking? Authorizing Provider  acetaminophen (TYLENOL) 500 MG tablet Take 1,000 mg by mouth every 8 (eight) hours as needed for mild pain or headache.    Yes [provider]  atorvastatin (LIPITOR) 20 MG tablet Take 1 pill three nights/week (Mon-Wed-Fri) 07/19/19  Yes [provider]  montelukast (SINGULAIR) 10 MG tablet Take 10 mg by mouth daily. 07/11/22  Yes [provider]  pantoprazole (PROTONIX) 40 MG tablet Take 1 tablet (40 mg total) by mouth 2 (two) times daily. 10/14/19   Yes Cirigliano, Vito V, DO  sertraline (ZOLOFT) 50 MG tablet  07/26/22  Yes [provider]  valsartan (DIOVAN) 80 MG tablet Take 80 mg by mouth daily. 06/05/22  Yes [provider]  gabapentin (NEURONTIN) 300 MG capsule Take 1 capsule (300 mg total) by mouth at bedtime. Start with 1 capsule at bedtime. If after 2 weeks it is well tolerated, you may increase to twice a day Patient not taking: Reported on 04/29/2020 09/26/19   Yetta Flock, MD  ibuprofen (ADVIL) 800 MG tablet Take 800 mg by mouth as needed.    [provider]    Current Outpatient Medications  Medication Sig Dispense Refill   acetaminophen (TYLENOL) 500 MG tablet Take 1,000 mg by mouth every 8 (eight) hours as needed for mild pain or headache.      atorvastatin (LIPITOR) 20 MG tablet Take 1 pill three nights/week (Mon-Wed-Fri)     montelukast (SINGULAIR) 10 MG tablet Take 10 mg by mouth daily.     pantoprazole (PROTONIX) 40 MG tablet Take 1 tablet (40 mg total) by mouth 2 (two) times daily. 60 tablet 3   sertraline (ZOLOFT) 50 MG tablet      valsartan (DIOVAN) 80 MG tablet Take 80 mg by mouth daily.     gabapentin (NEURONTIN) 300 MG capsule Take 1 capsule (300 mg total) by mouth at bedtime. Start with 1 capsule at bedtime. If after 2 weeks it is well tolerated, you may increase to twice  a day (Patient not taking: Reported on 04/29/2020) 30 capsule 3   ibuprofen (ADVIL) 800 MG tablet Take 800 mg by mouth as needed.     Current Facility-Administered Medications  Medication Dose Route Frequency Provider Last Rate Last Admin   0.9 %  sodium chloride infusion  500 mL Intravenous Once Khalib Fendley, Carlota Raspberry, MD        Allergies as of 08/02/2022 - Review Complete 08/02/2022  Allergen Reaction Noted   Toradol [ketorolac tromethamine] Nausea Only 09/05/2019    Family History  Problem Relation Age of Onset   Diabetes Mother    Kidney cancer Father    COPD Father    Hypertension Father    Heart  Problems Father    Colon polyps Father    Esophageal cancer Neg Hx    Colon cancer Neg Hx    Pancreatic cancer Neg Hx    Stomach cancer Neg Hx     Social History   Socioeconomic History   Marital status: Married    Spouse name: Not on file   Number of children: 2   Years of education: Not on file   Highest education level: Not on file  Occupational History   Not on file  Tobacco Use   Smoking status: Former   Smokeless tobacco: Current    Types: Snuff   Tobacco comments:    last night LAST CHEW per pt  Vaping Use   Vaping Use: Never used  Substance and Sexual Activity   Alcohol use: Yes    Comment: occassional   Drug use: No   Sexual activity: Not on file  Other Topics Concern   Not on file  Social History Narrative   Not on file   Social Determinants of Health   Financial Resource Strain: Not on file  Food Insecurity: Not on file  Transportation Needs: Not on file  Physical Activity: Not on file  Stress: Not on file  Social Connections: Not on file  Intimate Partner Violence: Not on file    Review of Systems: All other review of systems negative except as mentioned in the HPI.  Physical Exam: Vital signs BP (!) 144/85   Pulse 89   Temp 98.9 F (37.2 C)   Ht '6\' 3"'$  (1.905 m)   Wt 229 lb (103.9 kg)   SpO2 97%   BMI 28.62 kg/m   General:   Alert,  Well-developed, pleasant and cooperative in NAD Lungs:  Clear throughout to auscultation.   Heart:  Regular rate and rhythm Abdomen:  Soft, nontender and nondistended.   Neuro/Psych:  Alert and cooperative. Normal mood and affect. A and O x 3  Jolly Mango, MD [facility]

## 2022-08-02 NOTE — Progress Notes (Signed)
Sedate, gd SR, tolerated procedure well, VSS, report to RN 

## 2022-08-02 NOTE — Progress Notes (Signed)
Pt's states no medical or surgical changes since previsit or office visit. 

## 2022-08-02 NOTE — Patient Instructions (Signed)
6 polyps removed and sent to pathology.  Await results for final recommendations.  Handouts on findings given to patient.  (Polyps, diverticulosis, hemorrhoids)  - Resume previous diet. - Continue present medications. - Await pathology results.  YOU HAD AN ENDOSCOPIC PROCEDURE TODAY AT [facility]:   Refer to the procedure report that was given to you for any specific questions about what was found during the examination.  If the procedure report does not answer your questions, please call your gastroenterologist to clarify.  If you requested that your care partner not be given the details of your procedure findings, then the procedure report has been included in a sealed envelope for you to review at your convenience later.  YOU SHOULD EXPECT: Some feelings of bloating in the abdomen. Passage of more gas than usual.  Walking can help get rid of the air that was put into your GI tract during the procedure and reduce the bloating. If you had a lower endoscopy (such as a colonoscopy or flexible sigmoidoscopy) you may notice spotting of blood in your stool or on the toilet paper. If you underwent a bowel prep for your procedure, you may not have a normal bowel movement for a few days.  Please Note:  You might notice some irritation and congestion in your nose or some drainage.  This is from the oxygen used during your procedure.  There is no need for concern and it should clear up in a day or so.  SYMPTOMS TO REPORT IMMEDIATELY:  Following lower endoscopy (colonoscopy or flexible sigmoidoscopy):  Excessive amounts of blood in the stool  Significant tenderness or worsening of abdominal pains  Swelling of the abdomen that is new, acute  Fever of 100F or higher   For urgent or emergent issues, a gastroenterologist can be reached at any hour by calling [phone]. Do not use MyChart messaging for urgent concerns.    DIET:  We do recommend a small meal at first, but then you  may proceed to your regular diet.  Drink plenty of fluids but you should avoid alcoholic beverages for 24 hours.  ACTIVITY:  You should plan to take it easy for the rest of today and you should NOT DRIVE or use heavy machinery until tomorrow (because of the sedation medicines used during the test).    FOLLOW UP: Our staff will call the number listed on your records the next business day following your procedure.  We will call around 7:15- 8:00 am to check on you and address any questions or concerns that you may have regarding the information given to you following your procedure. If we do not reach you, we will leave a message.     If any biopsies were taken you will be contacted by phone or by letter within the next 1-3 weeks.  Please call us at [phone] if you have not heard about the biopsies in 3 weeks.    SIGNATURES/CONFIDENTIALITY: You and/or your care partner have signed paperwork which will be entered into your electronic medical record.  These signatures attest to the fact that that the information above on your After Visit Summary has been reviewed and is understood.  Full responsibility of the confidentiality of this discharge information lies with you and/or your care-partner.

## 2022-08-02 NOTE — Op Note (Signed)
[facility] Patient Name: Brian Tran Procedure Date: 08/02/2022 3:40 PM MRN: [id number] Endoscopist: Remo Lipps P. Havery Moros , MD, [id number] Age: 46 Referring MD:  Date of Birth: September 28, 1975 Gender: Male Account #: [id number] Procedure:                Colonoscopy Indications:              Screening for colorectal malignant neoplasm, This                            is the patient's first colonoscopy Medicines:                Monitored Anesthesia Care Procedure:                Pre-Anesthesia Assessment:                           - Prior to the procedure, a History and Physical                            was performed, and patient medications and                            allergies were reviewed. The patient's tolerance of                            previous anesthesia was also reviewed. The risks                            and benefits of the procedure and the sedation                            options and risks were discussed with the patient.                            All questions were answered, and informed consent                            was obtained. Prior Anticoagulants: The patient has                            taken no anticoagulant or antiplatelet agents. ASA                            Grade Assessment: II - A patient with mild systemic                            disease. After reviewing the risks and benefits,                            the patient was deemed in satisfactory condition to                            undergo the procedure.  After obtaining informed consent, the colonoscope                            was passed under direct vision. Throughout the                            procedure, the patient's blood pressure, pulse, and                            oxygen saturations were monitored continuously. The                            Colonoscope was introduced through the anus and                            advanced to the the  cecum, identified by                            appendiceal orifice and ileocecal valve. The                            colonoscopy was performed without difficulty. The                            patient tolerated the procedure well. The quality                            of the bowel preparation was good. The ileocecal                            valve, appendiceal orifice, and rectum were                            photographed. Scope In: 3:55:04 PM Scope Out: 4:14:38 PM Scope Withdrawal Time: 0 hours 15 minutes 22 seconds  Total Procedure Duration: 0 hours 19 minutes 34 seconds  Findings:                 The perianal and digital rectal examinations were                            normal.                           Two sessile polyps were found in the cecum. The                            polyps were 2 to 4 mm in size. These polyps were                            removed with a cold snare. Resection and retrieval                            were complete.  Two sessile polyps were found in the ascending                            colon. The polyps were 2 to 4 mm in size. These                            polyps were removed with a cold snare. Resection                            and retrieval were complete.                           A 3 mm polyp was found in the sigmoid colon. The                            polyp was sessile. The polyp was removed with a                            cold snare. Resection and retrieval were complete.                           A 4 mm polyp was found in the recto-sigmoid colon.                            The polyp was sessile. The polyp was removed with a                            cold snare. Resection and retrieval were complete.                           Multiple small-mouthed diverticula were found in                            the sigmoid colon.                           Internal hemorrhoids were found during                             retroflexion. The hemorrhoids were small.                           The exam was otherwise without abnormality. Complications:            No immediate complications. Estimated blood loss:                            Minimal. Estimated Blood Loss:     Estimated blood loss was minimal. Impression:               - Two 2 to 4 mm polyps in the cecum, removed with a                            cold snare. Resected  and retrieved.                           - Two 2 to 4 mm polyps in the ascending colon,                            removed with a cold snare. Resected and retrieved.                           - One 3 mm polyp in the sigmoid colon, removed with                            a cold snare. Resected and retrieved.                           - One 4 mm polyp at the recto-sigmoid colon,                            removed with a cold snare. Resected and retrieved.                           - Diverticulosis in the sigmoid colon.                           - Internal hemorrhoids.                           - The examination was otherwise normal. Recommendation:           - Patient has a contact number available for                            emergencies. The signs and symptoms of potential                            delayed complications were discussed with the                            patient. Return to normal activities tomorrow.                            Written discharge instructions were provided to the                            patient.                           - Resume previous diet.                           - Continue present medications.                           - Await pathology results. Remo Lipps P. Caellum Mancil, MD 08/02/2022 4:19:23 PM This report has been signed electronically.

## 2022-08-02 NOTE — Progress Notes (Signed)
Called to room to assist during endoscopic procedure.  Patient ID and intended procedure confirmed with present staff. Received instructions for my participation in the procedure from the performing physician.  

## 2022-08-03 ENCOUNTER — Telehealth: Payer: Self-pay

## 2022-08-03 NOTE — Telephone Encounter (Signed)
  Follow up Call-     08/02/2022    3:17 PM 07/13/2021   10:01 AM  Call back number  Post procedure Call Back phone  # [phone]  Permission to leave phone message Yes      Patient questions:  Do you have a fever, pain , or abdominal swelling? No. Pain Score  0 *  Have you tolerated food without any problems? Yes.    Have you been able to return to your normal activities? Yes.    Do you have any questions about your discharge instructions: Diet   No. Medications  No. Follow up visit  No.  Do you have questions or concerns about your Care? No.  Actions: * If pain score is 4 or above: No action needed, pain <4.

## 2022-08-31 ENCOUNTER — Ambulatory Visit: Payer: 59

## 2022-11-03 ENCOUNTER — Encounter (HOSPITAL_BASED_OUTPATIENT_CLINIC_OR_DEPARTMENT_OTHER): Payer: Self-pay

## 2022-11-03 ENCOUNTER — Emergency Department (HOSPITAL_BASED_OUTPATIENT_CLINIC_OR_DEPARTMENT_OTHER)
Admission: EM | Admit: 2022-11-03 | Discharge: 2022-11-03 | Disposition: A | Discharge status: Home / Self Care | Payer: 59 | Attending: Emergency Medicine | Admitting: Emergency Medicine

## 2022-11-03 ENCOUNTER — Emergency Department (HOSPITAL_BASED_OUTPATIENT_CLINIC_OR_DEPARTMENT_OTHER): Payer: 59

## 2022-11-03 ENCOUNTER — Other Ambulatory Visit (HOSPITAL_BASED_OUTPATIENT_CLINIC_OR_DEPARTMENT_OTHER): Payer: Self-pay

## 2022-11-03 ENCOUNTER — Other Ambulatory Visit: Payer: Self-pay

## 2022-11-03 DIAGNOSIS — I10 Essential (primary) hypertension: Secondary | ICD-10-CM | POA: Diagnosis not present

## 2022-11-03 DIAGNOSIS — Z79899 Other long term (current) drug therapy: Secondary | ICD-10-CM | POA: Diagnosis not present

## 2022-11-03 DIAGNOSIS — Z8673 Personal history of transient ischemic attack (TIA), and cerebral infarction without residual deficits: Secondary | ICD-10-CM | POA: Diagnosis not present

## 2022-11-03 DIAGNOSIS — R42 Dizziness and giddiness: Secondary | ICD-10-CM | POA: Diagnosis not present

## 2022-11-03 DIAGNOSIS — R200 Anesthesia of skin: Secondary | ICD-10-CM | POA: Diagnosis present

## 2022-11-03 LAB — DIFFERENTIAL
Abs Immature Granulocytes: 0.01 10*3/uL (ref 0.00–0.07)
Basophils Absolute: 0.1 10*3/uL (ref 0.0–0.1)
Basophils Relative: 1 %
Eosinophils Absolute: 0.2 10*3/uL (ref 0.0–0.5)
Eosinophils Relative: 3 %
Immature Granulocytes: 0 %
Lymphocytes Relative: 34 %
Lymphs Abs: 2.1 10*3/uL (ref 0.7–4.0)
Monocytes Absolute: 0.6 10*3/uL (ref 0.1–1.0)
Monocytes Relative: 9 %
Neutro Abs: 3.4 10*3/uL (ref 1.7–7.7)
Neutrophils Relative %: 53 %

## 2022-11-03 LAB — COMPREHENSIVE METABOLIC PANEL
ALT: 48 U/L — ABNORMAL HIGH (ref 0–44)
AST: 24 U/L (ref 15–41)
Albumin: 4.7 g/dL (ref 3.5–5.0)
Alkaline Phosphatase: 74 U/L (ref 38–126)
Anion gap: 10 (ref 5–15)
BUN: 9 mg/dL (ref 6–20)
CO2: 26 mmol/L (ref 22–32)
Calcium: 9.3 mg/dL (ref 8.9–10.3)
Chloride: 101 mmol/L (ref 98–111)
Creatinine, Ser: 0.94 mg/dL (ref 0.61–1.24)
GFR, Estimated: >60 mL/min (ref >60)
Glucose, Bld: 103 mg/dL — ABNORMAL HIGH (ref 70–99)
Potassium: 3.6 mmol/L (ref 3.5–5.1)
Sodium: 137 mmol/L (ref 135–145)
Total Bilirubin: 0.5 mg/dL (ref 0.3–1.2)
Total Protein: 7.6 g/dL (ref 6.5–8.1)

## 2022-11-03 LAB — PROTIME-INR
INR: 0.9 (ref 0.8–1.2)
Prothrombin Time: 12.2 seconds (ref 11.4–15.2)

## 2022-11-03 LAB — CBC
HCT: 48.4 % (ref 39.0–52.0)
Hemoglobin: 16.2 g/dL (ref 13.0–17.0)
MCH: 31.5 pg (ref 26.0–34.0)
MCHC: 33.5 g/dL (ref 30.0–36.0)
MCV: 94 fL (ref 80.0–100.0)
Platelets: 280 10*3/uL (ref 150–400)
RBC: 5.15 MIL/uL (ref 4.22–5.81)
RDW: 12.8 % (ref 11.5–15.5)
WBC: 6.4 10*3/uL (ref 4.0–10.5)
nRBC: 0 % (ref 0.0–0.2)

## 2022-11-03 LAB — APTT: aPTT: 26 seconds (ref 24–36)

## 2022-11-03 LAB — ETHANOL: Alcohol, Ethyl (B): <10 mg/dL (ref <10)

## 2022-11-03 LAB — CBG MONITORING, ED: Glucose-Capillary: 95 mg/dL (ref 70–99)

## 2022-11-03 MED ORDER — GADOBUTROL 1 MMOL/ML IV SOLN
10.0000 mL | Freq: Once | INTRAVENOUS | Status: AC | PRN
  Administered 2022-11-03: 10 mL via INTRAVENOUS

## 2022-11-03 MED ORDER — AMLODIPINE BESYLATE 5 MG PO TABS: 5.0000 mg | ORAL_TABLET | Freq: Every day | ORAL | 0 refills | Status: DC

## 2022-11-03 MED ORDER — SODIUM CHLORIDE 0.9% FLUSH
3.0000 mL | Freq: Once | INTRAVENOUS | Status: AC
  Administered 2022-11-03: 3 mL via INTRAVENOUS

## 2022-11-03 MED ORDER — AMLODIPINE BESYLATE 5 MG PO TABS
5.0000 mg | ORAL_TABLET | Freq: Every day | ORAL | 0 refills | Status: DC
  Filled 2022-11-03: qty 30, 30d supply, fill #0

## 2022-11-03 NOTE — ED Notes (Signed)
Patient transported to MRI 

## 2022-11-03 NOTE — ED Provider Notes (Signed)
[facility] Provider Note   CSN: [id number] Arrival date & time: 11/03/22  1451     History  Chief Complaint  Patient presents with   Numbness    Brian Tran is a 47 y.o. male.  HPI   Patient presented to the ED for evaluation of dizziness numbness and a sense of being off balance.  Patient developed some vertigo type symptoms few days ago.  Patient was seen by his primary doctor and was diagnosed with peripheral vertigo.  Patient states his symptoms pretty much resolved.  This afternoon he had sudden episode of acute dizziness associated with feeling numbness and tingling in his left hand as well as his face bilaterally.  Patient called EMS and his symptoms were decreasing.  He decided to come to the ED for evaluation.  Upon arrival patient states symptoms returned.  Code stroke was activated by triage team.  Patient states he still feels some numbness in his left hand.  He also feels weakness in both of his legs although more like he is off balance than having difficulty moving them.  Home Medications Prior to Admission medications   Medication Sig Start Date End Date Taking? Authorizing Provider  amLODipine (NORVASC) 5 MG tablet Take 1 tablet (5 mg total) by mouth daily. 11/03/22  Yes Dorie Rank, MD  acetaminophen (TYLENOL) 500 MG tablet Take 1,000 mg by mouth every 8 (eight) hours as needed for mild pain or headache.     [provider]  albuterol (VENTOLIN HFA) 108 (90 Base) MCG/ACT inhaler Inhale 2 puffs into the lungs every 6 (six) hours as needed. 10/11/22   [provider]  atorvastatin (LIPITOR) 20 MG tablet Take 1 pill three nights/week (Mon-Wed-Fri) 07/19/19   [provider]  fluticasone (FLONASE) 50 MCG/ACT nasal spray Place 1 spray into both nostrils daily. 10/18/22   [provider]  gabapentin (NEURONTIN) 300 MG capsule Take 1 capsule (300 mg total) by mouth at bedtime. Start with 1 capsule  at bedtime. If after 2 weeks it is well tolerated, you may increase to twice a day Patient not taking: Reported on 04/29/2020 09/26/19   Yetta Flock, MD  gabapentin (NEURONTIN) 600 MG tablet Take 600 mg by mouth 3 (three) times daily. 10/16/22   [provider]  ibuprofen (ADVIL) 800 MG tablet Take 800 mg by mouth as needed.    [provider]  meclizine (ANTIVERT) 25 MG tablet Take 25 mg by mouth 3 (three) times daily as needed. 10/26/22   [provider]  montelukast (SINGULAIR) 10 MG tablet Take 10 mg by mouth daily. 07/11/22   [provider]  pantoprazole (PROTONIX) 40 MG tablet Take 1 tablet (40 mg total) by mouth 2 (two) times daily. 10/14/19   Cirigliano, Vito V, DO  sertraline (ZOLOFT) 50 MG tablet  07/26/22   [provider]  traMADol (ULTRAM) 50 MG tablet PLEASE SEE ATTACHED FOR DETAILED DIRECTIONS 10/25/22   [provider]  valsartan (DIOVAN) 80 MG tablet Take 80 mg by mouth daily. 06/05/22   [provider]      Allergies    Toradol [ketorolac tromethamine]    Review of Systems   Review of Systems  Physical Exam Updated Vital Signs BP (!) 137/98   Pulse 87   Temp 98.4 F (36.9 C) (Oral)   Resp 18   Ht 1.905 m (6' 3"$ )   Wt 102.1 kg   SpO2 95%   BMI 28.12  kg/m  Physical Exam Vitals and nursing note reviewed.  Constitutional:      Appearance: He is well-developed. He is not diaphoretic.  HENT:     Head: Normocephalic and atraumatic.     Right Ear: External ear normal.     Left Ear: External ear normal.  Eyes:     General: No visual field deficit or scleral icterus.       Right eye: No discharge.        Left eye: No discharge.     Conjunctiva/sclera: Conjunctivae normal.  Neck:     Trachea: No tracheal deviation.  Cardiovascular:     Rate and Rhythm: Normal rate and regular rhythm.  Pulmonary:     Effort: Pulmonary effort is normal. No respiratory distress.     Breath sounds: Normal breath sounds.  No stridor. No wheezing or rales.  Abdominal:     General: Bowel sounds are normal. There is no distension.     Palpations: Abdomen is soft.     Tenderness: There is no abdominal tenderness. There is no guarding or rebound.  Musculoskeletal:        General: No tenderness.     Cervical back: Neck supple.  Skin:    General: Skin is warm and dry.     Findings: No rash.  Neurological:     Mental Status: He is alert and oriented to person, place, and time.     Cranial Nerves: No cranial nerve deficit, dysarthria or facial asymmetry.     Sensory: No sensory deficit.     Motor: No abnormal muscle tone, seizure activity or pronator drift.     Coordination: Coordination normal.     Comments:  able to hold both legs off bed for 5 seconds, sensation intact in all extremities,  no left or right sided neglect, normal finger-nose exam bilaterally, no nystagmus noted   Psychiatric:        Mood and Affect: Mood normal.     ED Results / Procedures / Treatments   Labs (all labs ordered are listed, but only abnormal results are displayed) Labs Reviewed  COMPREHENSIVE METABOLIC PANEL - Abnormal; Notable for the following components:      Result Value   Glucose, Bld 103 (*)    ALT 48 (*)    All other components within normal limits  PROTIME-INR  APTT  CBC  DIFFERENTIAL  ETHANOL  CBG MONITORING, ED    EKG EKG Interpretation  Date/Time:  Friday November 03 2022 14:55:07 EST Ventricular Rate:  87 PR Interval:  132 QRS Duration: 94 QT Interval:  352 QTC Calculation: 423 R Axis:   31 Text Interpretation: Normal sinus rhythm Normal ECG When compared with ECG of 29-Jun-2019 02:42, PREVIOUS ECG IS PRESENT Confirmed by Dorie Rank [phone]) on 11/03/2022 3:01:34 PM  Radiology MR ANGIO HEAD WO CONTRAST  Result Date: 11/03/2022 CLINICAL DATA:  Transient ischemic attack. Neuro deficit, dizziness, left arm numbness. EXAM: MRA HEAD WITHOUT CONTRAST TECHNIQUE: Angiographic images of the [place] were acquired using MRA technique without intravenous contrast. COMPARISON:  Same-day brain MRI and MRA neck 11/03/2022. FINDINGS: Anterior circulation: The intracranial internal carotid arteries are patent. The M1 middle cerebral arteries are patent. Moderate stenosis within the mid to distal right M1 segment. Atherosclerotic irregularity of the M2 and more distal MCA vessels, bilaterally. Most notably, there is a severe stenosis within a proximal M2 left MCA vessel (series 102, image 9). The anterior cerebral arteries are patent. The right A1 segment  is hypoplastic or absent. No intracranial aneurysm is identified. Posterior circulation: The intracranial vertebral arteries are patent. The basilar artery is patent. The posterior cerebral arteries are patent. Mild to moderate stenosis within the right PCA distal P2 segment. Posterior communicating arteries are present bilaterally. Anatomic variants: As described. IMPRESSION: 1. No intracranial large vessel occlusion is identified. 2. Intracranial sclerotic disease, most notably as follows. Moderate stenosis within the right middle cerebral artery M1 segment. Severe stenosis within a proximal M2 left middle cerebral artery vessel vessel. Moderate stenosis within the right PCA P2 segment. Electronically Signed   By: Kellie Simmering D.O.   On: 11/03/2022 18:20   MR ANGIO NECK W WO CONTRAST  Result Date: 11/03/2022 CLINICAL DATA:  Provided history: Transient ischemic attack (TIA). Neuro deficit, dizziness, left arm numbness. EXAM: MRA NECK WITHOUT AND WITH CONTRAST TECHNIQUE: Multiplanar and multiecho pulse sequences of the neck were obtained without and with intravenous contrast. Angiographic images of the neck were obtained using MRA technique without and with intravenous contrast. CONTRAST:  14m GADAVIST GADOBUTROL 1 MMOL/ML IV SOLN COMPARISON:  Same day MRI brain and MRA head 11/03/2022. FINDINGS: Mildly motion degraded exam. Aortic arch: Standard aortic  branching. The visualized aortic arch is normal in caliber. No hemodynamically significant innominate or proximal subclavian artery stenosis. Right carotid system: CCA and ICA patent within the neck without stenosis. Left carotid system: CCA and ICA patent within the neck without stenosis. Vertebral arteries: Codominant and patent within the neck. Apparent severe stenosis at the origin of the left vertebral artery. No significant stenosis within the cervical right vertebral artery. IMPRESSION: 1. Mildly motion degraded exam. 2. The common carotid and internal carotid arteries are patent within the neck without stenosis. 3. The vertebral arteries are codominant and patent within the neck. Apparent severe stenosis at the origin of the left vertebral artery. Electronically Signed   By: KKellie SimmeringD.O.   On: 11/03/2022 18:10   MR BRAIN WO CONTRAST  Result Date: 11/03/2022 CLINICAL DATA:  Provided history: Numbness or tingling, paresthesia. Neuro deficit, dizziness, left-sided arm numbness. EXAM: MRI HEAD WITHOUT CONTRAST TECHNIQUE: Multiplanar, multiecho pulse sequences of the brain and surrounding structures were obtained without intravenous contrast. COMPARISON:  Head CT performed earlier today 11/03/2022. FINDINGS: Brain: Cerebral volume is normal. Multifocal T2 FLAIR hyperintense signal abnormality within the cerebral white matter, mild but greater than expected for age. There is no acute infarct. No evidence of an intracranial mass. No chronic intracranial blood products. No extra-axial fluid collection. No midline shift. Vascular: Maintained flow voids within the proximal large arterial vessels. Skull and upper cervical spine: No focal suspicious marrow lesion. Susceptibility artifact arising from ACDF hardware. Sinuses/Orbits: No mass or acute finding within the imaged orbits. Small mucous retention cyst within the inferior left maxillary sinus. Other: Trace fluid within the left mastoid air cells.  IMPRESSION: 1. No evidence of an acute intracranial abnormality. 2. Multifocal T2 FLAIR hyperintense signal abnormality within the cerebral white matter, mild but greater than expected for age. These signal changes are nonspecific and differential considerations include chronic small vessel ischemic disease, sequelae of chronic migraine headaches, sequelae of a prior infectious/inflammatory process or sequelae of a demyelinating process (such as multiple sclerosis), among others. No lesions specifically suggest demyelinating disease. 3. Small mucous retention cyst within the left maxillary sinus. Electronically Signed   By: KKellie SimmeringD.O.   On: 11/03/2022 18:04   CT HEAD CODE STROKE WO CONTRAST  Result Date: 11/03/2022 CLINICAL DATA:  Code stroke.  EXAM: CT HEAD WITHOUT CONTRAST TECHNIQUE: Contiguous axial images were obtained from the base of the skull through the vertex without intravenous contrast. RADIATION DOSE REDUCTION: This exam was performed according to the departmental dose-optimization program which includes automated exposure control, adjustment of the mA and/or kV according to patient size and/or use of iterative reconstruction technique. COMPARISON:  CT head March 08, 2017. FINDINGS: Brain: No evidence of acute large vascular territory infarction, hemorrhage, hydrocephalus, extra-axial collection or mass lesion/mass effect. Vascular: No hyperdense vessel identified. Skull: No acute fracture. Sinuses/Orbits: Clear sinuses.  No acute orbital findings. ASPECTS [facility] Stroke Program Early CT Score) total score (0-10 with 10 being normal): 10. IMPRESSION: 1. No evidence of acute intracranial abnormality. 2. ASPECTS is 10. Code stroke imaging results were communicated on 11/03/2022 at 3:13 pm to provider Zenia Resides via telephone, who verbally acknowledged these results. Electronically Signed   By: Margaretha Sheffield M.D.   On: 11/03/2022 15:14    Procedures Procedures    Medications Ordered in  ED Medications  sodium chloride flush (NS) 0.9 % injection 3 mL (3 mLs Intravenous Given 11/03/22 1524)  gadobutrol (GADAVIST) 1 MMOL/ML injection 10 mL (10 mLs Intravenous Contrast Given 11/03/22 1713)    ED Course/ Medical Decision Making/ A&P Clinical Course as of 11/03/22 1855  Fri Nov 03, 2022  1543 Patient was seen by the code stroke neurologist.  Head CT does not show any acute findings.  Recommendation is for MRI pain with MRA angio of the head and neck.  Those orders were placed by the stroke neurologist.  We actually have MRI available today at this freestanding ED [JK]  1556 Labs reviewed CBC normal.  PT normal [JK]  1844 Case reviewed with Dr. Tobias Alexander.  Patient stable for outpatient follow-up. [JK]    Clinical Course User Index [JK] Dorie Rank, MD                             Medical Decision Making Problems Addressed: Hypertension, unspecified type: acute illness or injury that poses a threat to life or bodily functions Numbness: acute illness or injury that poses a threat to life or bodily functions  Amount and/or Complexity of Data Reviewed Labs: ordered. Decision-making details documented in ED Course. Radiology: ordered and independent interpretation performed. Discussion of management or test interpretation with external provider(s): Case was discussed with neurology group, Dr. Leonel Ramsay.  Dr. Leonie Man also evaluated the patient directly via telehealth visit  Risk Prescription drug management.   Patient presented to the ED for evaluation of numbness in the setting of recent vertigo as well as hypertension.  Concerned about the possibility of stroke TIA.  Patient without any focal deficits noted on exam.  Patient was eval by the stroke team on arrival via telehealth.  Patient CT scan does not show any acute findings.  MRI was also performed and it does not show any evidence of acute stroke or acute obstruction.  Nonspecific findings noted.  Could be related to his  history of hypertension hypercholesterolemia.  At this point patient appears appropriate for outpatient neurology follow-up.  Evaluation and diagnostic testing in the emergency department does not suggest an emergent condition requiring admission or immediate intervention beyond what has been performed at this time.  The patient is safe for discharge and has been instructed to return immediately for worsening symptoms, change in symptoms or any other concerns.        Final Clinical Impression(s) / ED  Diagnoses Final diagnoses:  Numbness  Hypertension, unspecified type    Rx / DC Orders ED Discharge Orders          Ordered    Ambulatory referral to Neurology       Comments: An appointment is requested in approximately: 1 week   11/03/22 1854    amLODipine (NORVASC) 5 MG tablet  Daily        11/03/22 1854              Dorie Rank, MD 11/03/22 [phone]

## 2022-11-03 NOTE — Progress Notes (Signed)
[facility] Telemedicine Consult   Requesting Provider: Dorie Rank Consult Participants: Patient, atrium nurse and bedside nurse Location of the provider: [facility] Location of the patient: [place]  This consult was provided via telemedicine with 2-way video and audio communication. The patient/family was informed that care would be provided in this way and agreed to receive care in this manner.    Chief Complaint: Face and hand numbness  HPI: Patient is a 47 year old Caucasian male who developed sudden onset of numbness dizziness and gait imbalance today at 1 PM.  He stated both face and numbness involving the cheeks as well as left hand and forearm.  He also felt dizzy and slightly off balance.  He denied any headache, slurred speech, loss of vision, diplopia or vertigo.  Denies any weakness in his hands.  Patient states a few days ago he had some dizziness and was evaluated by his primary care physician and told he needed positional vertigo.  That symptoms lasted a few days and then resolved completely.  He denies any known prior history of strokes, TIAs, seizures or significant neurological problems.  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  He also has history of carpal tunnel syndrome bilaterally and cervical radiculopathy in the right arm.  CT scan of the head personally reviewed showed no acute abnormality and aspect score of 10 MRI scan and MRAs are pending LKW: 1 PM tpa given?: No, to mild symptoms to treat IR Thrombectomy? No, clinical presentation not compatible with LVO Modified Rankin Scale: 1-No significant post stroke disability and can perform usual duties with stroke symptoms Time of teleneurologist evaluation: [id number]  Exam: Vitals:   11/03/22 1454  BP: (!) 177/102  Pulse: 90  Resp: 18  Temp: 99.4 F (37.4 C)  SpO2: 100%    General: Mildly obese  pleasant middle-age Caucasian male not in distress He is awake and interactive.  He is mildly anxious.  No aphasia apraxia or dysarthria.  Extraocular movements are full range without nystagmus.  Face is symmetric without weakness.  Tongue is midline.  Motor system exam shows symmetric upper and lower extremity strength without focal weakness.  Mild fine action tremor of outstretched upper extremities.  Gait not tested 1A: Level of Consciousness - 0 1B: Ask Month and Age - 0 1C: 'Blink Eyes' & 'Squeeze Hands' - 0 2: Test Horizontal Extraocular Movements - 0 3: Test Visual Fields - 0 4: Test Facial Palsy - 0 5A: Test Left Arm Motor Drift - 0 5B: Test Right Arm Motor Drift - 0 6A: Test Left Leg Motor Drift - 0 6B: Test Right Leg Motor Drift - 0 7: Test Limb Ataxia - 0 8: Test Sensation - 1 9: Test Language/Aphasia- 0 10: Test Dysarthria - 0 11: Test Extinction/Inattention - 0 NIHSS score: 1   Imaging Reviewed: CT head without contrast is normal.  Aspect score of 10. MRI scan and MRAs are pending Labs reviewed in epic and pertinent values follow: Comprehensive metabolic panel is unremarkable except ALT of 48 CBC is normal.  PT PTT INR are normal.  Assessment: 47 year old Caucasian male with sudden onset of numbness involving bilateral face and left hand along with dizziness and balance of unclear etiology.  Possibilities include posterior circulation TIA versus small infarct versus high cervical lower brainstem lesion given his prior history of degenerative cervical spine disease. Patient's symptoms are too mild to treat to justify  IV thrombolysis at this point.  May reconsider however if his symptoms get worse.  Clinical exam is not suggestive of an LVO. Recommendations: Will not recommend IV thrombolysis at this point as his symptoms are too mild to treat.  Recommend further evaluation with checking MRI scan of the brain with MRA of the brain and neck.  If patient's neurological symptoms  get worse he develops more deficits may reconsider decision to treat with thrombolysis.  If MRI scan is negative patient may be discharged home and if it is positive for stroke recommend transfer to [facility] for further evaluation treatment.  Had a long discussion with patient regarding his presentation discussed risk benefits of IV thrombolysis and he is in agreement with treatment plan.  Discussed with Dr. Marye Round, ER, MD.    This patient is receiving care for possible acute neurological changes. There was 65 minutes of care by this provider at the time of service, including time for direct evaluation via telemedicine, review of medical records, imaging studies and discussion of findings with providers, the patient and/or family.  Antony Contras, MD  [facility] [phone]  If 7pm- 7am, please page neurology on call as listed in [place].

## 2022-11-03 NOTE — ED Triage Notes (Signed)
Patient here POV from home.  Endorses Dizziness that began at 1300 today. Shortly afterwards he began to have left sided Arm Numbness and generalized facial Numbness.  Some Chest tightness as well.   Anxious in Triage. A&Ox4. GCS 15. BIB Wheelchair.

## 2022-11-03 NOTE — ED Notes (Signed)
[place] activated from triage for pt arriving pov reporting 1300 onset of dizziness followed by numbness to left hand and lower face bilaterally. MRS 0 Fang D neg as assessed by this RN.   1500 Pt to CT 1505 Dr Leonie Man joins cart 1506 Pt returns to CT

## 2022-11-03 NOTE — Discharge Instructions (Addendum)
Follow-up with your primary care doctor to recheck your blood pressure.  Follow-up with a neurologist for further evaluation.  I wrote a prescription for Norvasc to take if you have recurrent spikes in your blood pressure.  Start taking that if you notice blood pressures greater than 160/100

## 2023-09-26 ENCOUNTER — Other Ambulatory Visit (HOSPITAL_COMMUNITY): Payer: Self-pay | Admitting: Orthopaedic Surgery

## 2023-09-26 DIAGNOSIS — R2689 Other abnormalities of gait and mobility: Secondary | ICD-10-CM

## 2023-10-03 ENCOUNTER — Ambulatory Visit (HOSPITAL_COMMUNITY)
Admission: RE | Admit: 2023-10-03 | Discharge: 2023-10-03 | Disposition: A | Discharge status: Home / Self Care | Payer: 59 | Source: Ambulatory Visit | Attending: Orthopaedic Surgery | Admitting: Orthopaedic Surgery

## 2023-10-03 DIAGNOSIS — R2689 Other abnormalities of gait and mobility: Secondary | ICD-10-CM | POA: Insufficient documentation

## 2024-04-24 ENCOUNTER — Telehealth: Payer: Self-pay

## 2024-04-24 NOTE — Telephone Encounter (Signed)
 Could one of you please call patient and ask him who he wants to see for his GI care.  He has been a patient of Dr. Leigh since 2020 but recently saw [facility] in June.  If he wants to continue to follow with them he needs to cancel his appointment for 8-13.  Thank you

## 2024-04-30 ENCOUNTER — Encounter: Payer: Self-pay | Admitting: Gastroenterology

## 2024-04-30 ENCOUNTER — Ambulatory Visit: Admitting: Gastroenterology

## 2024-04-30 VITALS — BP 102/66 | HR 74 | Ht 75.0 in | Wt 228.1 lb

## 2024-04-30 DIAGNOSIS — K219 Gastro-esophageal reflux disease without esophagitis: Secondary | ICD-10-CM

## 2024-04-30 DIAGNOSIS — K920 Hematemesis: Secondary | ICD-10-CM | POA: Diagnosis not present

## 2024-04-30 DIAGNOSIS — R09A2 Foreign body sensation, throat: Secondary | ICD-10-CM | POA: Diagnosis not present

## 2024-04-30 DIAGNOSIS — Z791 Long term (current) use of non-steroidal anti-inflammatories (NSAID): Secondary | ICD-10-CM | POA: Diagnosis not present

## 2024-04-30 DIAGNOSIS — Z860100 Personal history of colon polyps, unspecified: Secondary | ICD-10-CM

## 2024-04-30 NOTE — Patient Instructions (Addendum)
 You have been scheduled for an endoscopy. Please follow written instructions given to you at your visit today.  If you use inhalers (even only as needed), please bring them with you on the day of your procedure.  If you take any of the following medications, they will need to be adjusted prior to your procedure:   DO NOT TAKE 7 DAYS PRIOR TO TEST- Trulicity (dulaglutide) Ozempic, Wegovy (semaglutide) Mounjaro (tirzepatide) Bydureon Bcise (exanatide extended release)  DO NOT TAKE 1 DAY PRIOR TO YOUR TEST Rybelsus (semaglutide) Adlyxin (lixisenatide) Victoza (liraglutide) Byetta (exanatide) _________________________________________________________________________  Increase Protonix  to 40 mg twice a day for 4 to 6 weeks.  You will be due for a recall colonoscopy in 07-2025. We will send you a reminder in the mail when it gets closer to that time.    Thank you for entrusting me with your care and for choosing [facility], Dr. Elspeth Naval  ______________________________________________________  If your blood pressure at your visit was 140/90 or greater, please contact your primary care physician to follow up on this.  _______________________________________________________  If you are age 35 or older, your body mass index should be between 23-30. Your Body mass index is 28.51 kg/m. If this is out of the aforementioned range listed, please consider follow up with your Primary Care Provider.  If you are age 74 or younger, your body mass index should be between 19-25. Your Body mass index is 28.51 kg/m. If this is out of the aformentioned range listed, please consider follow up with your Primary Care Provider.   ________________________________________________________  The [facility] providers would like to encourage you to use MYCHART to communicate with providers for non-urgent requests or questions.  Due to long hold times on the telephone, sending your provider a  message by [facility] may be a faster and more efficient way to get a response.  Please allow 48 business hours for a response.  Please remember that this is for non-urgent requests.  _______________________________________________________  [facility] is using a team-based approach to care.  Your team is made up of your doctor and two to three APPS. Our APPS (Nurse Practitioners and Physician Assistants) work with your physician to ensure care continuity for you. They are fully qualified to address your health concerns and develop a treatment plan. They communicate directly with your gastroenterologist to care for you. Seeing the Advanced Practice Practitioners on your physician's team can help you by facilitating care more promptly, often allowing for earlier appointments, access to diagnostic testing, procedures, and other specialty referrals.

## 2024-04-30 NOTE — Progress Notes (Signed)
 HPI :  48 y/o male known to me for GERD, history of colon polyps, here for reassessment for GERD, hematemesis, chronic PPI use.   I last saw him in November 2023.  Recall he has had reflux for a long time.  He previously has trialed omeprazole , Nexium, Prevacid, Aciphex, and Protonix .  Currently on Protonix  40 mg daily and has been so for years.  Previously had seen Dr. San for TIF evaluation but declined at that time.  He states in general on pantoprazole  40 mg daily his pyrosis is mostly controlled.  For the past 6 months he is intermittently experienced globus sensation.  He states he feels a bubble in his throat that can come and go, since that something is in there.  He does endorse rare dysphagia if he is eating bread or a very large bite of something but he feels subjectively that is related to the plates in his neck from prior neck surgery.  This is not too bothersome for him, but the globus is more concerning for him.  He has trialed Protonix  twice daily dosing and states that seems to help significantly when he does that.  He mostly takes it once per day.  Recall he has chronic pain, followed by pain management, spinal stimulator in place.  He does take a baby aspirin every day for preventative purposes and has been taking ibuprofen  anywhere from 1200 to 1600 mg/day, roughly 2 to 3 days/week as needed.  He also takes Tylenol  upwards of 2 g/day if needed.  He does not drink alcohol when he takes Tylenol .  He states typically he does not have any nausea or vomiting but last night he woke up nauseated and had an episode of vomiting with some red blood in it, hematemesis.  This is the first time he ever seen it before and is rather anxious about this and we discussed endoscopy.  He thinks he has had a history of ulcers in the past but is not sure.  He denies any cardiopulmonary symptoms.  No blood in his stools, he has not had any recurrent symptoms since last night and is not sure why  this happened.  He did not feel sick to his stomach yesterday.  His colonoscopy is up-to-date November 2023, has a recall in place for surveillance of polyps in November 2026.      GERD history: -Index symptoms: Regurgitation, waterbrash, belching, pyrosis -Medications trialed: omperazole, Nexium, Prevacid, AcipHex, Protonix  -Current medications: Protonix  40 mg/day (increasing to bid today) -Complications: Small HH   GERD evaluation: -EGD: 08/2019.  1 cm sliding-type HH, otherwise normal -Barium esophagram: 09/2019: Tiny HH, otherwise normal.  Normal motility.  No provocative maneuvers -Esophageal Manometry: None -pH/Impedance: None   Endoscopic History: -EGD (08/2022): 1 cm HH, fundic gland polyps   Colonoscopy 08/02/22: - The perianal and digital rectal examinations were normal. - Two sessile polyps were found in the cecum. The polyps were 2 to 4 mm in size. These polyps were removed with a cold snare. Resection and retrieval were complete. - Two sessile polyps were found in the ascending colon. The polyps were 2 to 4 mm in size. These polyps were removed with a cold snare. Resection and retrieval were complete. - A 3 mm polyp was found in the sigmoid colon. The polyp was sessile. The polyp was removed with a cold snare. Resection and retrieval were complete. - A 4 mm polyp was found in the recto-sigmoid colon. The polyp was sessile. The polyp was  removed with a cold snare. Resection and retrieval were complete. - Multiple small-mouthed diverticula were found in the sigmoid colon. - Internal hemorrhoids were found during retroflexion. The hemorrhoids were small. - The exam was otherwise without abnormality.  1. Surgical [P], colon, sigmoid, rectosigmoid, polyp (2) - TUBULAR ADENOMA. - NO HIGH GRADE DYSPLASIA OR MALIGNANCY. - HYPERPLASTIC POLYP. - NO DYSPLASIA OR MALIGNANCY. 2. Surgical [P], colon, cecum, ascending, polyp (4) - TUBULAR ADENOMA(S). - NO HIGH GRADE DYSPLASIA OR  MALIGNANCY.      Past Medical History:  Diagnosis Date   Anxiety    DDD (degenerative disc disease), cervical    Fatty liver    GERD (gastroesophageal reflux disease)    Hyperlipidemia    Hypertension    Neuromuscular disorder (HCC)    right ulnar nerve pain   Seasonal allergies      Past Surgical History:  Procedure Laterality Date   ANTERIOR CERVICAL DECOMPRESSION/DISCECTOMY FUSION 4 LEVELS N/A 10/25/2016   Procedure: ANTERIOR CERVICAL DECOMPRESSION/DISCECTOMY FUSION  CERVICAL THREE-CERVICAL FOUR  CERVICAL FOUR-CERVICAL FIVE ,CERVICAL FIVE-CERVICAL SIX ,CERVICAL SIX-CERVICAL SEVEN;  Surgeon: Lamar Peaches, MD;  Location: [facility] OR;  Service: Neurosurgery;  Laterality: N/A;   BRONCHOSCOPY     HERNIA REPAIR Right    inguinal hernia   LUMBAR LAMINECTOMY/DECOMPRESSION MICRODISCECTOMY     WISDOM TOOTH EXTRACTION     Family History  Problem Relation Age of Onset   Diabetes Mother    Kidney cancer Father    COPD Father    Hypertension Father    Heart Problems Father    Colon polyps Father    Esophageal cancer Neg Hx    Colon cancer Neg Hx    Pancreatic cancer Neg Hx    Stomach cancer Neg Hx    Social History   Tobacco Use   Smoking status: Former   Smokeless tobacco: Current    Types: Snuff   Tobacco comments:    last night LAST CHEW per pt  Vaping Use   Vaping status: Never Used  Substance Use Topics   Alcohol use: Yes    Comment: occassional   Drug use: No   Current Outpatient Medications  Medication Sig Dispense Refill   acetaminophen  (TYLENOL ) 500 MG tablet Take 1,000 mg by mouth every 8 (eight) hours as needed for mild pain or headache.      atorvastatin (LIPITOR) 20 MG tablet Take 1 pill three nights/week (Mon-Wed-Fri)     HYDROcodone -acetaminophen  (NORCO) 7.5-325 MG tablet Take 1 tablet by mouth every 6 (six) hours as needed.     naloxone (NARCAN) nasal spray 4 mg/0.1 mL Place 1 spray into the nose as needed.     pantoprazole  (PROTONIX ) 40 MG tablet  Take 1 tablet (40 mg total) by mouth 2 (two) times daily. (Patient taking differently: Take 40 mg by mouth daily.) 60 tablet 3   sertraline (ZOLOFT) 50 MG tablet  (Patient taking differently: Take 100 mg by mouth daily.)     valsartan (DIOVAN) 80 MG tablet Take 80 mg by mouth daily.     aspirin EC 81 MG tablet Take 81 mg by mouth daily.     No current facility-administered medications for this visit.   Allergies  Allergen Reactions   Toradol  [Ketorolac  Tromethamine ] Nausea Only     Review of Systems: All systems reviewed and negative except where noted in HPI.   Lab Results  Component Value Date   NA 137 11/03/2022   CL 101 11/03/2022   K 3.6 11/03/2022  CO2 26 11/03/2022   BUN 9 11/03/2022   CREATININE 0.94 11/03/2022   GFRNONAA >60 11/03/2022   CALCIUM 9.3 11/03/2022   ALBUMIN 4.7 11/03/2022   GLUCOSE 103 (H) 11/03/2022     Physical Exam: BP 102/66   Pulse 74   Ht 6' 3 (1.905 m)   Wt 228 lb 2 oz (103.5 kg)   SpO2 95%   BMI 28.51 kg/m  Constitutional: Pleasant,well-developed, male in no acute distress. Neurological: Alert and oriented to person place and time. Psychiatric: Normal mood and affect. Behavior is normal.   ASSESSMENT: 48 y.o. male here for assessment of the following  1. Gastroesophageal reflux disease, unspecified whether esophagitis present   2. Globus sensation   3. Hematemesis, unspecified whether nausea present   4. NSAID long-term use   5. History of colon polyps    Generally has done pretty well in regards to longstanding reflux symptoms on Protonix  40 mg daily.  He has failed other PPIs in the past and this regimen generally works for him.  Prior EGD has shown no evidence of Barrett's.  He did have a TIF eval a few years ago but has not wanted to pursue that.  More recently has developed a globus sensation in the past 6 months.  He has rare dysphagia with this, which he attributes to his orthopedic issues/plates in his neck.  Globus has  responded to higher dosing of PPI, certainly possible it is related to reflux.  We discussed long-term use of PPIs and associated risks.  His kidney functions normal, no osteoporosis.  Long-term want to use the lowest dose of PPI needed to control symptoms.  Given he has responded to higher dosing recently in regards to the globus, recommend he take pantoprazole  40 mg twice daily for 4 to 6-week trial to see if this can resolve his symptoms.  He will do so and we will see if this helps.  Otherwise he incidentally had a episode of hematemesis, sounds like small-volume, last night in the setting of vomiting.  He typically does not have this.  He has been using NSAIDs on top of baby aspirin for his back pain.  We discussed risks for PUD, unclear if he had Mallory-Weiss tear in the setting of vomiting/superficial bleeding versus PUD.  Given his globus symptoms and his recent episode of hematemesis I offered him an EGD.  For peace of mind he strongly wished to pursue this.  We discussed risks and benefits of endoscopy and anesthesia and he wants to proceed.  This is scheduled for next week.  He should minimize/avoid NSAID use in the interim and again will increase his pantoprazole  to twice daily.  Reviewed his colonoscopy report with him, due for surveillance in November 2026.   PLAN: - EGD at the [facility] -scheduled for next Monday - increase protonix  to 40mg  BID until then - take for 4-6 weeks, see if resolves globus, and then use the lowest daily dose needed to control symptoms - discussed long term use of PPIs and risks - minimize NSAID use for now - cautioned on use of tylenol  and alcohol at the same time, he tries to avoid this - repeat colonoscopy November 2026 for surveillance  Marcey Naval, MD [facility]

## 2024-05-05 ENCOUNTER — Ambulatory Visit (AMBULATORY_SURGERY_CENTER): Admitting: Gastroenterology

## 2024-05-05 ENCOUNTER — Encounter: Payer: Self-pay | Admitting: Gastroenterology

## 2024-05-05 VITALS — BP 105/66 | HR 68 | Temp 98.4°F | Resp 16 | Ht 75.0 in | Wt 228.0 lb

## 2024-05-05 DIAGNOSIS — K920 Hematemesis: Secondary | ICD-10-CM

## 2024-05-05 DIAGNOSIS — K3189 Other diseases of stomach and duodenum: Secondary | ICD-10-CM | POA: Diagnosis not present

## 2024-05-05 DIAGNOSIS — K449 Diaphragmatic hernia without obstruction or gangrene: Secondary | ICD-10-CM

## 2024-05-05 DIAGNOSIS — K317 Polyp of stomach and duodenum: Secondary | ICD-10-CM

## 2024-05-05 DIAGNOSIS — K319 Disease of stomach and duodenum, unspecified: Secondary | ICD-10-CM | POA: Diagnosis not present

## 2024-05-05 DIAGNOSIS — R09A2 Foreign body sensation, throat: Secondary | ICD-10-CM

## 2024-05-05 DIAGNOSIS — K219 Gastro-esophageal reflux disease without esophagitis: Secondary | ICD-10-CM

## 2024-05-05 MED ORDER — SODIUM CHLORIDE 0.9 % IV SOLN: 500.0000 mL | Freq: Once | INTRAVENOUS | Status: DC

## 2024-05-05 NOTE — Progress Notes (Signed)
 Pt's states no medical or surgical changes since previsit or office visit.

## 2024-05-05 NOTE — Progress Notes (Signed)
 Vss nad trans to pacu

## 2024-05-05 NOTE — Progress Notes (Signed)
 Called to room to assist during endoscopic procedure.  Patient ID and intended procedure confirmed with present staff. Received instructions for my participation in the procedure from the performing physician.

## 2024-05-05 NOTE — Patient Instructions (Signed)
 YOU HAD AN ENDOSCOPIC PROCEDURE TODAY AT THE [facility]:   Refer to the procedure report that was given to you for any specific questions about what was found during the examination.  If the procedure report does not answer your questions, please call your gastroenterologist to clarify.  If you requested that your care partner not be given the details of your procedure findings, then the procedure report has been included in a sealed envelope for you to review at your convenience later.  YOU SHOULD EXPECT: Some feelings of bloating in the abdomen. Passage of more gas than usual.  Walking can help get rid of the air that was put into your GI tract during the procedure and reduce the bloating. If you had a lower endoscopy (such as a colonoscopy or flexible sigmoidoscopy) you may notice spotting of blood in your stool or on the toilet paper. If you underwent a bowel prep for your procedure, you may not have a normal bowel movement for a few days.  Please Note:  You might notice some irritation and congestion in your nose or some drainage.  This is from the oxygen used during your procedure.  There is no need for concern and it should clear up in a day or so.  SYMPTOMS TO REPORT IMMEDIATELY:   Vomiting of blood or coffee ground material  New chest pain or pain under the shoulder blades  Painful or persistently difficult swallowing  New shortness of breath  Fever of 100F or higher  Black, tarry-looking stools  Resume previous diet Continue present medications Continue with trial of Protonix  - 40 mg twice a day for 4-6 weeks to see if this helps resolve symptoms Await pathology results  For urgent or emergent issues, a gastroenterologist can be reached at any hour by calling [phone]. Do not use MyChart messaging for urgent concerns.    DIET:  We do recommend a small meal at first, but then you may proceed to your regular diet.  Drink plenty of fluids but you should avoid  alcoholic beverages for 24 hours.  ACTIVITY:  You should plan to take it easy for the rest of today and you should NOT DRIVE or use heavy machinery until tomorrow (because of the sedation medicines used during the test).    FOLLOW UP: Our staff will call the number listed on your records the next business day following your procedure.  We will call around 7:15- 8:00 am to check on you and address any questions or concerns that you may have regarding the information given to you following your procedure. If we do not reach you, we will leave a message.     If any biopsies were taken you will be contacted by phone or by letter within the next 1-3 weeks.  Please call us  at [phone] if you have not heard about the biopsies in 3 weeks.    SIGNATURES/CONFIDENTIALITY: You and/or your care partner have signed paperwork which will be entered into your electronic medical record.  These signatures attest to the fact that that the information above on your After Visit Summary has been reviewed and is understood.  Full responsibility of the confidentiality of this discharge information lies with you and/or your care-partner.

## 2024-05-05 NOTE — Progress Notes (Signed)
 History and Physical Interval Note: see note from 8/13 - history of GERD, recent globus and an episode of hematemesis. Now on protonix  40mg  BID. EGD to evaluate given recent hematemesis. History of NSAID use. NO interval changes since office visit. He feels well without complaints  05/05/2024 11:54 AM  Brian Tran  has presented today for endoscopic procedure(s), with the diagnosis of  Encounter Diagnoses  Name Primary?   Gastroesophageal reflux disease, unspecified whether esophagitis present Yes   Globus sensation    Hematemesis, unspecified whether nausea present   .  The various methods of evaluation and treatment have been discussed with the patient and/or family. After consideration of risks, benefits and other options for treatment, the patient has consented to  the endoscopic procedure(s).   The patient's history has been reviewed, patient examined, no change in status, stable for surgery.  I have reviewed the patient's chart and labs.  Questions were answered to the patient's satisfaction.    Marcey Naval, MD [facility]

## 2024-05-05 NOTE — Op Note (Signed)
 [facility] Patient Name: Brian Tran Procedure Date: 05/05/2024 11:48 AM MRN: [id number] Endoscopist: Elspeth P. Leigh , MD, [id number] Age: 48 Referring MD:  Date of Birth: 1975-12-14 Gender: Male Account #: [id number] Procedure:                Upper GI endoscopy Indications:              history of gastro-esophageal reflux disease - on                            protonix  40mg , recent episode of vomiting with                            hematemesis in the setting of NSAID use, recent                            Globus sensation Medicines:                Monitored Anesthesia Care Procedure:                Pre-Anesthesia Assessment:                           - Prior to the procedure, a History and Physical                            was performed, and patient medications and                            allergies were reviewed. The patient's tolerance of                            previous anesthesia was also reviewed. The risks                            and benefits of the procedure and the sedation                            options and risks were discussed with the patient.                            All questions were answered, and informed consent                            was obtained. Prior Anticoagulants: The patient has                            taken no anticoagulant or antiplatelet agents. ASA                            Grade Assessment: II - A patient with mild systemic                            disease. After reviewing the risks and benefits,  the patient was deemed in satisfactory condition to                            undergo the procedure.                           After obtaining informed consent, the endoscope was                            passed under direct vision. Throughout the                            procedure, the patient's blood pressure, pulse, and                            oxygen saturations were monitored  continuously. The                            GIF [id number] #[id number] was introduced through the                            mouth, and advanced to the second part of duodenum.                            The upper GI endoscopy was accomplished without                            difficulty. The patient tolerated the procedure                            well. Scope In: Scope Out: Findings:                 Esophagogastric landmarks were identified: the                            Z-line was found at 39 cm, the gastroesophageal                            junction was found at 39 cm and the upper extent of                            the gastric folds was found at 40 cm from the                            incisors.                           A 1 cm hiatal hernia was present.                           The Z-line was slightly irregular but did not meet                            criteria for Barrett's.  The exam of the esophagus was otherwise normal. No                            focal stenosis / stricture or inflammatory changes.                           Multiple small sessile polyps were found in the                            gastric fundus and in the gastric body. Likely                            benign fundic gland polyps in the setting of PPI                            use. Biopsies were taken with a cold forceps for                            histology.                           Striped mildly erythematous mucosa was found in the                            gastric antrum.                           The exam of the stomach was otherwise normal.                           Biopsies were taken with a cold forceps for                            Helicobacter pylori testing given erythema and the                            patient's NSAID use.                           The examined duodenum was normal. Complications:            No immediate complications. Estimated blood loss:                             Minimal. Estimated Blood Loss:     Estimated blood loss was minimal. Impression:               - Esophagogastric landmarks identified.                           - 1 cm hiatal hernia.                           - Z-line irregular but did not meet criteria for  Barrett's.                           - Normal esophagus otherwise.                           - Multiple gastric polyps. Biopsied.                           - Erythematous mucosa in the antrum.                           - Normal stomach - biopsied to rule out H pylori                           - Normal examined duodenum.                           Could have had mallory weiss tear causing prior                            episode of hematemesis, nothing concerning noted at                            this time. Recommendation:           - Patient has a contact number available for                            emergencies. The signs and symptoms of potential                            delayed complications were discussed with the                            patient. Return to normal activities tomorrow.                            Written discharge instructions were provided to the                            patient.                           - Resume previous diet.                           - Continue present medications.                           - Continue with trial of protonix  40mg  BID for 4-6                            weeks to see if this helps resolve globus symptoms                           - Await pathology results.  Elspeth P. Iceis Knab, MD 05/05/2024 12:18:05 PM This report has been signed electronically.

## 2024-05-06 ENCOUNTER — Telehealth: Payer: Self-pay | Admitting: *Deleted

## 2024-05-06 NOTE — Telephone Encounter (Signed)
  Follow up Call-     05/05/2024   10:30 AM 08/02/2022    3:17 PM  Call back number  Post procedure Call Back phone  # [phone]  Permission to leave phone message Yes Yes     Patient questions:  Do you have a fever, pain , or abdominal swelling? No. Pain Score  0 *  Have you tolerated food without any problems? Yes.    Have you been able to return to your normal activities? Yes.    Do you have any questions about your discharge instructions: Diet   No. Medications  No. Follow up visit  No.  Do you have questions or concerns about your Care? No.  Actions: * If pain score is 4 or above: No action needed, pain <4.

## 2024-05-08 LAB — SURGICAL PATHOLOGY

## 2024-05-09 ENCOUNTER — Ambulatory Visit: Payer: Self-pay | Admitting: Gastroenterology

## 2024-10-23 ENCOUNTER — Encounter: Payer: Self-pay | Admitting: Gastroenterology

## 2024-10-31 ENCOUNTER — Ambulatory Visit: Admitting: Nurse Practitioner

## 2024-11-18 ENCOUNTER — Ambulatory Visit: Admitting: Gastroenterology
